# Patient Record
Sex: Female | Born: 1935 | Race: White | Hispanic: No | Smoking: Never smoker
Health system: Southern US, Community
[De-identification: ages and names within clinical notes are randomized; demographics above are authoritative.]

## PROBLEM LIST (undated history)

## (undated) DIAGNOSIS — S065X9A Traumatic subdural hemorrhage with loss of consciousness of unspecified duration, initial encounter: Secondary | ICD-10-CM

## (undated) DIAGNOSIS — N390 Urinary tract infection, site not specified: Secondary | ICD-10-CM

## (undated) DIAGNOSIS — R4182 Altered mental status, unspecified: Secondary | ICD-10-CM

## (undated) DIAGNOSIS — I10 Essential (primary) hypertension: Secondary | ICD-10-CM

## (undated) DIAGNOSIS — S065XAA Traumatic subdural hemorrhage with loss of consciousness status unknown, initial encounter: Secondary | ICD-10-CM

## (undated) DIAGNOSIS — I219 Acute myocardial infarction, unspecified: Secondary | ICD-10-CM

## (undated) DIAGNOSIS — K922 Gastrointestinal hemorrhage, unspecified: Secondary | ICD-10-CM

## (undated) DIAGNOSIS — IMO0002 Reserved for concepts with insufficient information to code with codable children: Secondary | ICD-10-CM

## (undated) DIAGNOSIS — R1310 Dysphagia, unspecified: Secondary | ICD-10-CM

## (undated) DIAGNOSIS — IMO0001 Reserved for inherently not codable concepts without codable children: Secondary | ICD-10-CM

## (undated) DIAGNOSIS — M6282 Rhabdomyolysis: Secondary | ICD-10-CM

## (undated) DIAGNOSIS — F329 Major depressive disorder, single episode, unspecified: Secondary | ICD-10-CM

## (undated) DIAGNOSIS — F32A Depression, unspecified: Secondary | ICD-10-CM

## (undated) DIAGNOSIS — K219 Gastro-esophageal reflux disease without esophagitis: Secondary | ICD-10-CM

## (undated) DIAGNOSIS — F0150 Vascular dementia without behavioral disturbance: Secondary | ICD-10-CM

## (undated) HISTORY — DX: Acute myocardial infarction, unspecified: I21.9

## (undated) HISTORY — DX: Gastrointestinal hemorrhage, unspecified: K92.2

## (undated) HISTORY — DX: Depression, unspecified: F32.A

## (undated) HISTORY — PX: CHOLECYSTECTOMY: SHX55

## (undated) HISTORY — DX: Major depressive disorder, single episode, unspecified: F32.9

## (undated) HISTORY — DX: Reserved for concepts with insufficient information to code with codable children: IMO0002

## (undated) HISTORY — PX: HERNIA REPAIR: SHX51

## (undated) HISTORY — DX: Gastro-esophageal reflux disease without esophagitis: K21.9

## (undated) HISTORY — DX: Essential (primary) hypertension: I10

## (undated) HISTORY — DX: Vascular dementia without behavioral disturbance: F01.50

---

## 1999-02-14 ENCOUNTER — Other Ambulatory Visit: Admission: RE | Admit: 1999-02-14 | Discharge: 1999-02-14 | Payer: Self-pay | Admitting: Obstetrics and Gynecology

## 2000-03-23 ENCOUNTER — Encounter: Admission: RE | Admit: 2000-03-23 | Discharge: 2000-03-23 | Payer: Self-pay | Admitting: Obstetrics and Gynecology

## 2000-03-23 ENCOUNTER — Encounter: Payer: Self-pay | Admitting: Obstetrics and Gynecology

## 2000-07-01 ENCOUNTER — Other Ambulatory Visit: Admission: RE | Admit: 2000-07-01 | Discharge: 2000-07-01 | Payer: Self-pay | Admitting: Obstetrics and Gynecology

## 2001-01-14 ENCOUNTER — Ambulatory Visit (HOSPITAL_COMMUNITY): Admission: RE | Admit: 2001-01-14 | Discharge: 2001-01-14 | Payer: Self-pay | Admitting: Internal Medicine

## 2001-01-14 ENCOUNTER — Encounter: Payer: Self-pay | Admitting: Internal Medicine

## 2001-03-24 ENCOUNTER — Encounter: Payer: Self-pay | Admitting: Obstetrics and Gynecology

## 2001-03-24 ENCOUNTER — Encounter: Admission: RE | Admit: 2001-03-24 | Discharge: 2001-03-24 | Payer: Self-pay | Admitting: Obstetrics and Gynecology

## 2001-12-14 ENCOUNTER — Encounter: Payer: Self-pay | Admitting: Internal Medicine

## 2001-12-14 ENCOUNTER — Ambulatory Visit (HOSPITAL_COMMUNITY): Admission: RE | Admit: 2001-12-14 | Discharge: 2001-12-14 | Payer: Self-pay | Admitting: Internal Medicine

## 2002-01-04 ENCOUNTER — Encounter: Payer: Self-pay | Admitting: Surgery

## 2002-01-06 ENCOUNTER — Observation Stay (HOSPITAL_COMMUNITY): Admission: RE | Admit: 2002-01-06 | Discharge: 2002-01-08 | Payer: Self-pay | Admitting: Surgery

## 2002-04-05 ENCOUNTER — Encounter: Payer: Self-pay | Admitting: Obstetrics and Gynecology

## 2002-04-05 ENCOUNTER — Encounter: Admission: RE | Admit: 2002-04-05 | Discharge: 2002-04-05 | Payer: Self-pay | Admitting: Obstetrics and Gynecology

## 2002-09-13 ENCOUNTER — Encounter: Payer: Self-pay | Admitting: Internal Medicine

## 2002-09-13 ENCOUNTER — Encounter: Admission: RE | Admit: 2002-09-13 | Discharge: 2002-09-13 | Payer: Self-pay | Admitting: Internal Medicine

## 2002-09-27 ENCOUNTER — Encounter (INDEPENDENT_AMBULATORY_CARE_PROVIDER_SITE_OTHER): Payer: Self-pay | Admitting: Specialist

## 2002-09-27 ENCOUNTER — Observation Stay (HOSPITAL_COMMUNITY): Admission: RE | Admit: 2002-09-27 | Discharge: 2002-09-28 | Payer: Self-pay | Admitting: Surgery

## 2003-04-07 ENCOUNTER — Encounter: Payer: Self-pay | Admitting: Internal Medicine

## 2003-04-07 ENCOUNTER — Encounter: Admission: RE | Admit: 2003-04-07 | Discharge: 2003-04-07 | Payer: Self-pay | Admitting: Internal Medicine

## 2004-04-12 ENCOUNTER — Encounter: Admission: RE | Admit: 2004-04-12 | Discharge: 2004-04-12 | Payer: Self-pay | Admitting: Obstetrics and Gynecology

## 2005-04-14 ENCOUNTER — Encounter: Admission: RE | Admit: 2005-04-14 | Discharge: 2005-04-14 | Payer: Self-pay | Admitting: Obstetrics and Gynecology

## 2006-04-17 ENCOUNTER — Encounter: Admission: RE | Admit: 2006-04-17 | Discharge: 2006-04-17 | Payer: Self-pay | Admitting: Obstetrics and Gynecology

## 2007-04-20 ENCOUNTER — Encounter: Admission: RE | Admit: 2007-04-20 | Discharge: 2007-04-20 | Payer: Self-pay | Admitting: Obstetrics and Gynecology

## 2007-06-15 ENCOUNTER — Encounter: Admission: RE | Admit: 2007-06-15 | Discharge: 2007-06-15 | Payer: Self-pay | Admitting: Obstetrics and Gynecology

## 2009-01-31 ENCOUNTER — Encounter (HOSPITAL_BASED_OUTPATIENT_CLINIC_OR_DEPARTMENT_OTHER): Admission: RE | Admit: 2009-01-31 | Discharge: 2009-05-01 | Payer: Self-pay | Admitting: General Surgery

## 2009-05-07 ENCOUNTER — Encounter: Admission: RE | Admit: 2009-05-07 | Discharge: 2009-07-17 | Payer: Self-pay | Admitting: General Surgery

## 2009-06-22 ENCOUNTER — Inpatient Hospital Stay (HOSPITAL_COMMUNITY): Admission: EM | Admit: 2009-06-22 | Discharge: 2009-06-28 | Payer: Self-pay | Admitting: Emergency Medicine

## 2009-06-25 ENCOUNTER — Encounter (INDEPENDENT_AMBULATORY_CARE_PROVIDER_SITE_OTHER): Payer: Self-pay | Admitting: Internal Medicine

## 2009-06-25 ENCOUNTER — Ambulatory Visit: Payer: Self-pay | Admitting: *Deleted

## 2009-11-26 ENCOUNTER — Inpatient Hospital Stay (HOSPITAL_COMMUNITY): Admission: EM | Admit: 2009-11-26 | Discharge: 2009-11-30 | Payer: Self-pay | Admitting: Emergency Medicine

## 2010-11-05 ENCOUNTER — Ambulatory Visit (HOSPITAL_COMMUNITY): Admission: RE | Admit: 2010-11-05 | Discharge: 2010-11-05 | Payer: Self-pay | Admitting: Internal Medicine

## 2011-01-05 ENCOUNTER — Encounter: Payer: Self-pay | Admitting: Obstetrics and Gynecology

## 2011-01-06 ENCOUNTER — Encounter: Payer: Self-pay | Admitting: Obstetrics and Gynecology

## 2011-03-17 LAB — CBC
HCT: 25.9 % — ABNORMAL LOW (ref 36.0–46.0)
Hemoglobin: 8.7 g/dL — ABNORMAL LOW (ref 12.0–15.0)
Hemoglobin: 9.1 g/dL — ABNORMAL LOW (ref 12.0–15.0)
MCHC: 33.3 g/dL (ref 30.0–36.0)
MCHC: 33.5 g/dL (ref 30.0–36.0)
MCV: 88 fL (ref 78.0–100.0)
MCV: 88.4 fL (ref 78.0–100.0)
Platelets: 240 10*3/uL (ref 150–400)
Platelets: 279 10*3/uL (ref 150–400)
Platelets: 287 10*3/uL (ref 150–400)
RBC: 2.96 MIL/uL — ABNORMAL LOW (ref 3.87–5.11)
RBC: 3.13 MIL/uL — ABNORMAL LOW (ref 3.87–5.11)
RBC: 3.42 MIL/uL — ABNORMAL LOW (ref 3.87–5.11)
RDW: 16.3 % — ABNORMAL HIGH (ref 11.5–15.5)
WBC: 4.2 10*3/uL (ref 4.0–10.5)
WBC: 4.8 10*3/uL (ref 4.0–10.5)
WBC: 5.5 10*3/uL (ref 4.0–10.5)

## 2011-03-17 LAB — BASIC METABOLIC PANEL
BUN: 14 mg/dL (ref 6–23)
BUN: 6 mg/dL (ref 6–23)
CO2: 24 mEq/L (ref 19–32)
Calcium: 7.7 mg/dL — ABNORMAL LOW (ref 8.4–10.5)
Chloride: 120 mEq/L — ABNORMAL HIGH (ref 96–112)
Creatinine, Ser: 0.48 mg/dL (ref 0.4–1.2)
GFR calc non Af Amer: >60 mL/min (ref >60)
Glucose, Bld: 115 mg/dL — ABNORMAL HIGH (ref 70–99)
Sodium: 147 mEq/L — ABNORMAL HIGH (ref 135–145)

## 2011-03-18 LAB — CBC
HCT: 26.5 % — ABNORMAL LOW (ref 36.0–46.0)
HCT: 27.2 % — ABNORMAL LOW (ref 36.0–46.0)
Hemoglobin: 8.9 g/dL — ABNORMAL LOW (ref 12.0–15.0)
Hemoglobin: 9.2 g/dL — ABNORMAL LOW (ref 12.0–15.0)
MCV: 87.8 fL (ref 78.0–100.0)
MCV: 88.8 fL (ref 78.0–100.0)
Platelets: 260 10*3/uL (ref 150–400)
Platelets: 274 10*3/uL (ref 150–400)
Platelets: 351 10*3/uL (ref 150–400)
RBC: 2.32 MIL/uL — ABNORMAL LOW (ref 3.87–5.11)
RBC: 3 MIL/uL — ABNORMAL LOW (ref 3.87–5.11)
RBC: 3.18 MIL/uL — ABNORMAL LOW (ref 3.87–5.11)
RDW: 16.2 % — ABNORMAL HIGH (ref 11.5–15.5)
WBC: 4.7 10*3/uL (ref 4.0–10.5)
WBC: 5.6 10*3/uL (ref 4.0–10.5)
WBC: 8 10*3/uL (ref 4.0–10.5)

## 2011-03-18 LAB — URINALYSIS, ROUTINE W REFLEX MICROSCOPIC
Bilirubin Urine: NEGATIVE
Glucose, UA: NEGATIVE mg/dL
Hgb urine dipstick: NEGATIVE
Ketones, ur: NEGATIVE mg/dL
Ketones, ur: NEGATIVE mg/dL
Nitrite: NEGATIVE
Protein, ur: 30 mg/dL — AB
Specific Gravity, Urine: 1.021 (ref 1.005–1.030)
Urobilinogen, UA: 0.2 mg/dL (ref 0.0–1.0)
Urobilinogen, UA: 0.2 mg/dL (ref 0.0–1.0)
pH: 5.5 (ref 5.0–8.0)
pH: 6.5 (ref 5.0–8.0)

## 2011-03-18 LAB — CROSSMATCH
ABO/RH(D): O POS
Antibody Screen: NEGATIVE

## 2011-03-18 LAB — COMPREHENSIVE METABOLIC PANEL
ALT: 21 U/L (ref 0–35)
Alkaline Phosphatase: 40 U/L (ref 39–117)
BUN: 24 mg/dL — ABNORMAL HIGH (ref 6–23)
CO2: 25 mEq/L (ref 19–32)
Chloride: 119 mEq/L — ABNORMAL HIGH (ref 96–112)
Glucose, Bld: 74 mg/dL (ref 70–99)
Potassium: 3.8 mEq/L (ref 3.5–5.1)
Sodium: 147 mEq/L — ABNORMAL HIGH (ref 135–145)
Total Bilirubin: 0.7 mg/dL (ref 0.3–1.2)

## 2011-03-18 LAB — BASIC METABOLIC PANEL
Chloride: 107 mEq/L (ref 96–112)
Chloride: 114 mEq/L — ABNORMAL HIGH (ref 96–112)
GFR calc Af Amer: 34 mL/min — ABNORMAL LOW (ref >60)
GFR calc non Af Amer: 39 mL/min — ABNORMAL LOW (ref >60)
Glucose, Bld: 67 mg/dL — ABNORMAL LOW (ref 70–99)
Potassium: 4 mEq/L (ref 3.5–5.1)
Potassium: 4.7 mEq/L (ref 3.5–5.1)
Sodium: 140 mEq/L (ref 135–145)
Sodium: 145 mEq/L (ref 135–145)

## 2011-03-18 LAB — URINE MICROSCOPIC-ADD ON

## 2011-03-18 LAB — CARDIAC PANEL(CRET KIN+CKTOT+MB+TROPI)
CK, MB: 17 ng/mL — ABNORMAL HIGH (ref 0.3–4.0)
CK, MB: 18.1 ng/mL — ABNORMAL HIGH (ref 0.3–4.0)
Relative Index: 2.4 (ref 0.0–2.5)
Total CK: 692 U/L — ABNORMAL HIGH (ref 7–177)

## 2011-03-18 LAB — URINE CULTURE
Culture: NO GROWTH
Special Requests: NEGATIVE

## 2011-03-18 LAB — RPR: RPR Ser Ql: NONREACTIVE

## 2011-03-18 LAB — TSH: TSH: 2.021 u[IU]/mL (ref 0.350–4.500)

## 2011-03-18 LAB — FOLATE: Folate: >20 ng/mL

## 2011-03-23 LAB — BASIC METABOLIC PANEL
BUN: 5 mg/dL — ABNORMAL LOW (ref 6–23)
Calcium: 8.6 mg/dL (ref 8.4–10.5)
Chloride: 105 mEq/L (ref 96–112)
Creatinine, Ser: 0.83 mg/dL (ref 0.4–1.2)
GFR calc Af Amer: >60 mL/min (ref >60)
GFR calc non Af Amer: >60 mL/min (ref >60)
GFR calc non Af Amer: >60 mL/min (ref >60)
Potassium: 4.1 mEq/L (ref 3.5–5.1)
Sodium: 137 mEq/L (ref 135–145)
Sodium: 141 mEq/L (ref 135–145)

## 2011-03-23 LAB — DIFFERENTIAL
Basophils Absolute: 0 10*3/uL (ref 0.0–0.1)
Basophils Relative: 0 % (ref 0–1)
Monocytes Absolute: 0.8 10*3/uL (ref 0.1–1.0)
Neutro Abs: 12.1 10*3/uL — ABNORMAL HIGH (ref 1.7–7.7)
Neutrophils Relative %: 89 % — ABNORMAL HIGH (ref 43–77)

## 2011-03-23 LAB — COMPREHENSIVE METABOLIC PANEL
AST: 30 U/L (ref 0–37)
Alkaline Phosphatase: 46 U/L (ref 39–117)
Alkaline Phosphatase: 50 U/L (ref 39–117)
BUN: 10 mg/dL (ref 6–23)
BUN: 8 mg/dL (ref 6–23)
CO2: 24 mEq/L (ref 19–32)
CO2: 25 mEq/L (ref 19–32)
Chloride: 114 mEq/L — ABNORMAL HIGH (ref 96–112)
Chloride: 114 mEq/L — ABNORMAL HIGH (ref 96–112)
Creatinine, Ser: 0.44 mg/dL (ref 0.4–1.2)
GFR calc Af Amer: >60 mL/min (ref >60)
GFR calc non Af Amer: >60 mL/min (ref >60)
GFR calc non Af Amer: >60 mL/min (ref >60)
Glucose, Bld: 101 mg/dL — ABNORMAL HIGH (ref 70–99)
Potassium: 3.7 mEq/L (ref 3.5–5.1)
Potassium: 3.9 mEq/L (ref 3.5–5.1)
Total Bilirubin: 0.7 mg/dL (ref 0.3–1.2)
Total Bilirubin: 0.8 mg/dL (ref 0.3–1.2)
Total Protein: 5.2 g/dL — ABNORMAL LOW (ref 6.0–8.3)

## 2011-03-23 LAB — CBC
HCT: 32.1 % — ABNORMAL LOW (ref 36.0–46.0)
HCT: 35.3 % — ABNORMAL LOW (ref 36.0–46.0)
HCT: 36.9 % (ref 36.0–46.0)
Hemoglobin: 11.9 g/dL — ABNORMAL LOW (ref 12.0–15.0)
Hemoglobin: 13.9 g/dL (ref 12.0–15.0)
MCHC: 31.9 g/dL (ref 30.0–36.0)
MCV: 85.8 fL (ref 78.0–100.0)
MCV: 86.2 fL (ref 78.0–100.0)
MCV: 86.4 fL (ref 78.0–100.0)
Platelets: 229 10*3/uL (ref 150–400)
Platelets: 263 10*3/uL (ref 150–400)
RBC: 3.72 MIL/uL — ABNORMAL LOW (ref 3.87–5.11)
RBC: 4.28 MIL/uL (ref 3.87–5.11)
RBC: 5.03 MIL/uL (ref 3.87–5.11)
WBC: 13.5 10*3/uL — ABNORMAL HIGH (ref 4.0–10.5)
WBC: 6.1 10*3/uL (ref 4.0–10.5)
WBC: 6.3 10*3/uL (ref 4.0–10.5)
WBC: 8.4 10*3/uL (ref 4.0–10.5)

## 2011-03-23 LAB — LIPID PANEL
Cholesterol: 160 mg/dL (ref 0–200)
HDL: 75 mg/dL (ref >39)
Total CHOL/HDL Ratio: 2.1 RATIO

## 2011-03-23 LAB — URINE MICROSCOPIC-ADD ON

## 2011-03-23 LAB — CULTURE, BLOOD (ROUTINE X 2)

## 2011-03-23 LAB — URINALYSIS, ROUTINE W REFLEX MICROSCOPIC
Bilirubin Urine: NEGATIVE
Glucose, UA: NEGATIVE mg/dL
Protein, ur: NEGATIVE mg/dL
Specific Gravity, Urine: 1.02 (ref 1.005–1.030)
Urobilinogen, UA: 0.2 mg/dL (ref 0.0–1.0)

## 2011-03-23 LAB — TSH: TSH: 2.446 u[IU]/mL (ref 0.350–4.500)

## 2011-03-23 LAB — APTT: aPTT: 38 seconds — ABNORMAL HIGH (ref 24–37)

## 2011-03-23 LAB — PROTIME-INR: Prothrombin Time: 14.8 seconds (ref 11.6–15.2)

## 2011-03-23 LAB — CK TOTAL AND CKMB (NOT AT ARMC)
CK, MB: 3 ng/mL (ref 0.3–4.0)
CK, MB: 46.4 ng/mL — ABNORMAL HIGH (ref 0.3–4.0)
Relative Index: 1.8 (ref 0.0–2.5)
Relative Index: 2.7 — ABNORMAL HIGH (ref 0.0–2.5)
Total CK: 1732 U/L — ABNORMAL HIGH (ref 7–177)

## 2011-03-23 LAB — URINE CULTURE
Colony Count: >=100000
Special Requests: NEGATIVE

## 2011-03-23 LAB — HEPATIC FUNCTION PANEL
AST: 70 U/L — ABNORMAL HIGH (ref 0–37)
Albumin: 3 g/dL — ABNORMAL LOW (ref 3.5–5.2)
Total Protein: 6.3 g/dL (ref 6.0–8.3)

## 2011-03-23 LAB — CARDIAC PANEL(CRET KIN+CKTOT+MB+TROPI)
Relative Index: 1.4 (ref 0.0–2.5)
Total CK: 934 U/L — ABNORMAL HIGH (ref 7–177)
Troponin I: 0.52 ng/mL (ref 0.00–0.06)

## 2011-03-23 LAB — SEDIMENTATION RATE: Sed Rate: 20 mm/hr (ref 0–22)

## 2011-03-23 LAB — T4, FREE
Free T4: 0.76 ng/dL — ABNORMAL LOW (ref 0.80–1.80)
Free T4: 0.93 ng/dL (ref 0.80–1.80)

## 2011-04-29 NOTE — Assessment & Plan Note (Signed)
[facility]   NAME:  Nancy [name], Nancy [name]                  ACCOUNT NO.:  [id number]   MEDICAL RECORD NO.:  [id number]      DATE OF BIRTH:  1924-09-02   PHYSICIAN:  Jonelle Sports. Sevier, M.D.       VISIT DATE:                                   OFFICE VISIT   HISTORY:  This is an 77 year old white female has been followed  generally by Dr. Lurene Shadow for a large wound on the distal anteromedial  pretibial area of the right lower extremity and small pressure ulcers on  the Achilles tendon area posteriorly at both heels.  She was debrided  significantly of the right lower extremity wound and was begun on  treatment with Santyl ointment changed on a daily basis.  That extremity  had been placed on Kerlix and Coban wrap.   The heel wounds have been treated with simply nonstick dressings.  She  had been instructed in proper offloading.   Since that visit, the patient and her family report no particular  problems.   PHYSICAL EXAMINATION:  VITAL SIGNS:  Blood pressure 116/67, pulse 81,  respirations 18, and temperature 97.4.  EXTREMITIES:  A large wound on the right anterior lower extremity  measures 11.0 x 6.0 x 0.1 cm and actually its base is  reasonably clean,  but quite strikingly dry.  There is no evidence of infection.   The heel wounds continue to be relatively innocent, although the one on  the left is slightly open.   A new wound is added today when some dry skin is removed at the area of  the tibial tubercle on the left leg and a small underlying sore is  revealed.   IMPRESSION:  Considerable improvement of right lower extremity wound and  stability of other wounds.   DISPOSITION:  The heel wounds and the new wound at the left knee will be  treated with daily applications of Neosporin and Band-Aids with proper  cleansing with each dressing change.   The right lower extremity will be treated with hydrogel, Prisma, Kerlix,  and Coban wrap.  Home health nurses will  change that on a q.3-day basis.   Follow up visit will be here in 2 weeks.           ______________________________  Jonelle Sports Cheryll Cockayne, M.D.     RES/MEDQ  D:  03/05/2009  T:  03/06/2009  Job:  [id number]

## 2011-04-29 NOTE — Assessment & Plan Note (Signed)
[facility]   NAME:  Nancy [name], Nancy [name]                  ACCOUNT NO.:  [id number]   MEDICAL RECORD NO.:  [id number]      DATE OF BIRTH:  October 22, 1924   PHYSICIAN:  Oretha Milch, MD            VISIT DATE:                                   OFFICE VISIT   This lady is back for reevaluation and treatment after a week.  She has  venous stasis ulcers and may have an ischemic ulcer on the plantar  aspect of her left great toe.  I debrided an area about 2 cm in diameter  off of her left toe and got down to fairly good pink granulation tissue.  I did this with the scissors and curette.  The ulcers on the anterior  aspect of her right lower extremity looked clean with good granulation  tissue.  All of her skin on both of her calves have the typical venous  stasis dermatitis.  We put back an Unna boot on the right leg and simple  compression on the left leg as there are no ulcers, and then we put on a  Promogran dressing on the left great toe.  They will come back in a  week.      Oretha Milch, MD     RVA/MEDQ  D:  06/04/2009  T:  06/04/2009  Job:  [id number]

## 2011-04-29 NOTE — Assessment & Plan Note (Signed)
[facility]   NAME:  Nancy [name], Nancy [name] NO.:  [id number]   MEDICAL RECORD NO.:  [id number]      DATE OF BIRTH:  June 09, 1924   PHYSICIAN:  Leonie Man, M.D.         VISIT DATE:                                   OFFICE VISIT   [name] is an 77 year old female with a medial right leg venous stasis  ulcer.  Current treatments have been with serial selective debridements  followed by Prisma, hydrogel, and Kerlix wrap followed by Coban.  She  returns for reevaluation today.  The ulcer is significantly smaller  measuring 1 cm x 6.4 cm x 0.2 cm without any evidence of odor or any  significant drainage.   On examination, she is afebrile.  Temperature 98.1, pulse 92,  respirations 80, blood pressure 137/79.   We will continue her on Prisma, hydrogel, with a Kerlix wrap covered by  Coban.  We will have her seen by home health care 3 times weekly, and  she will follow up with [place] in 1 week.      Leonie Man, M.D.  Electronically Signed     PB/MEDQ  D:  04/09/2009  T:  04/10/2009  Job:  [id number]

## 2011-04-29 NOTE — H&P (Signed)
Nancy [name], Nancy [name] NO.:  [id number]   MEDICAL RECORD NO.:  [id number]          PATIENT TYPE:  EMS   LOCATION:  ED                           FACILITY:  [facility]   PHYSICIAN:  Vania Rea, M.D. DATE OF BIRTH:  1924/08/24   DATE OF ADMISSION:  06/22/2009  DATE OF DISCHARGE:                              HISTORY & PHYSICAL   PRIMARY CARE PHYSICIAN:  Dr. Lucky Cowboy   SURGEON:  Dr. Leonie Man   CHIEF COMPLAINT:  Altered mental status.   HISTORY OF PRESENT ILLNESS:  This is an 77 year old Caucasian lady who  lives alone but has home health come in 3 times a week to dress her skin  ulcers and also has [employer] come in with her daily meal who  usually ambulates without the assistance of a cane and who was found  down on the floor this afternoon by her son.  Last contact with this  patient was last night, and it is unclear exactly how long she was down  on the ground, but it may be as much as 18 hours.  She was brought to  the emergency room and evaluated, found to be dehydrated, confused,  evidence of urinary tract infection and evidence of an acute myocardial  infarction.  Cardiologist was called for admission but felt that acute  intervention was contraindicated and recommended that hospitalist  service admit for medical stabilization, and cardiologist will follow  along as a consult.   Family says that patient does read the newspaper every day, but she does  not usually appear to be fully alert and with it.  She does not usually  manage her own financial affairs, but she has not been formally  diagnosed with dementia.  She has chronic leg ulcers and is seen at the  wound clinic every Monday and seen by home health nurse for dressings  every Wednesday and Friday.  There have been no complaints of chest pain  or shortness of breath.  She does report episodic dizziness.  She does  have a remote history of a craniotomy for a subdural hematoma in  1997  and has had difficulty with ambulation since then.  There is no history  of fever, cough or cold.  She does not have history of lower extremity  edema, but her lower extremity ulcers carry a diagnosis of venous stasis  ulcers.   She has been steadily losing weight over the past 2 years, and she is  estimated to have lost about 30 pounds over that period of time.   PAST MEDICAL HISTORY:  1. History of subdural hematoma as noted above.  2. Chronic lower extremity ulcers.  3. Sacral decubitus ulcer.  4. Status post hernia repair 5 years ago.  5. Status post cholecystectomy.   MEDICATIONS:  None.   ALLERGIES:  None.   SOCIAL HISTORY:  No history of tobacco, alcohol or illicit drug use.  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  She was widowed in 1986, and she  has one son who lives nearby and assists with  looking after her.   FAMILY HISTORY:  Unable to obtain at this time.   REVIEW OF SYSTEMS:  Other than those above, unable to obtain further.   PHYSICAL EXAMINATION:  GENERAL:  Pleasant, elderly, Caucasian lady lying  in the bed who appears dehydrated but does not appear distressed.  Very  conversational.  She is oriented only to herself but not to place or  time.  VITAL SIGNS:  Temperature is 96.8, pulse 88 and irregular, respiration  18, blood pressure 111/61.  She is saturating at 100% on 2 L.  HEENT:  Her pupils are round and equal.  Mucous membranes are pink, dry,  anicteric.  She is completely edentulous.  She has no oral Candida.  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  NECK:  She has wasting of the neck muscles, no cervical lymphadenopathy  or thyromegaly.  No carotid bruit, no jugular venous distention.  CHEST:  Clear to auscultation bilaterally.  CARDIOVASCULAR SYSTEM:  Frequent irregular beats, no murmur heard.  ABDOMEN:  Scaphoid, soft and nontender.  EXTREMITIES:  She has compression bandage of left lower extremity from   toes to knee.  Right is bandaged, and right leg is erythematous with  multiple ulcers with various stages of healing and discoloration of the  skin.  She has fresh abrasion to the left knee.  SACRAL AREA:  She has  stage II decubitus ulcer on her sacrum covered by a bandage.  CENTRAL NERVOUS SYSTEM:  Cranial nerves II-XII appear to be grossly  intact, and she has no focal lateralizing signs.   LABORATORIES:  White count his 13.5, hemoglobin 13.9, MCV 86.3,  platelets 271, absolute granulocyte count 12.1.  Her sodium is 141,  potassium 4.1, chloride 106, CO2 of 24, BUN 21, creatinine 0.8, calcium  8.6.  Total CK is 1732, CK-MB 6.4 with a relative index of 2.7.  Her  troponin is 0.73.  PTT is 38, PT 14.8, INR 1.1.  Urinalysis shows cloudy  urine with specific gravity of 1.020, 40 ketones, nitrite positive and  leukocyte esterase trace.  There is no protein, and urinalysis shows  rare epithelial cells, white cells 21-50 with many bacteria.   CT scan of the head without contrast shows no acute abnormality, atrophy  with chronic small vessel white matter __________ demyelination.  Lumbar  x-ray shows normal alignment, no fracture.  There is no chest x-ray as  yet.  Her EKG shows sinus rhythm with premature ventricular complexes as  well as premature supraventricular complexes, ST segment depression in  the inferior leads and prolonged QT.   ASSESSMENT:  1. Acute myocardial infarction.  2. Urinary tract infection.  3. Infected lower extremity ulcers.  4. Sacral decubitus ulcer.  5. Delirium/dementia.  6. Malnutrition.  7. Dehydration.   PLAN:  antibiotic  We will admit this lady for intravenous fluid hydration and antibiotic  therapy for her skin wounds and urinary tract infection after  appropriate cultures.  We will consider vascular studies of her lower  extremity.  Cardiac problems as per cardiology.      Vania Rea, M.D.  Electronically Signed     LC/MEDQ  D:   06/22/2009  T:  06/22/2009  Job:  [id number]   cc:   Ricki Rodriguez, M.D.  Fax: [phone]   Annye Rusk, MD

## 2011-04-29 NOTE — Assessment & Plan Note (Signed)
[facility]   NAME:  Nancy [name], Nancy [name] NO.:  [id number]   MEDICAL RECORD NO.:  [id number]      DATE OF BIRTH:  [birth year]   PHYSICIAN:  Joanne Gavel, M.D.         VISIT DATE:  04/02/2009                                   OFFICE VISIT   This is an 77 year old female with a medial right stasis ulcer.  Current  treatments have been multiple debridements with Prisma, hydrogel,  Kerlix, and Coban wrap.   On evaluation, the patient is awake and alert.  Afebrile.  There is  essentially no slough in the wound.  There is some nice granulation  tissue.  The edges are intact without erythema.   IMPRESSION:  The patient appears to be improving with very little  necrotic material present.   PLAN:  Continue Prisma, hydrogel, Kerlix, and Coban.  See in 7 days.      Joanne Gavel, M.D.  Electronically Signed     RA/MEDQ  D:  04/02/2009  T:  04/03/2009  Job:  [id number]

## 2011-04-29 NOTE — Assessment & Plan Note (Signed)
[facility]   NAME:  Nancy, [name]                  ACCOUNT NO.:  [id number]   MEDICAL RECORD NO.:  [id number]      DATE OF BIRTH:  02-Nov-1924   PHYSICIAN:  Leonie Man, M.D.         VISIT DATE:                                   OFFICE VISIT   PROBLEM:  Bilateral venous stasis disease with right-sided leg ulcer  which is decreased since last measurement to 7.5 x 5.2 x 0.1 cm.  The  patient is not having any pain.  There is minimal drainage and there is  no odor from the wound.   Most recent treatment has been with Prisma, hydrogel, Kerlix, and Coban.   PHYSICAL EXAMINATION:  Temperature 97.6, pulse 85, respirations 17, and  blood pressure is 111/67.  Examination of the wound shows that there is  a good granulations throughout except for very small area of slough  measuring approximately 0.1 cm.  The surrounding wound skin on the right  leg shows significant desquamation, but it is not particularly dry.   On the left leg, there is no open wound, but there is some increase  swelling of the leg today.  This is not tender.  It seems a little bit  warmer than usual.  I think this is just from inflammation of her venous  stasis disease.  I do not discern any infection here.   TREATMENT AND PLANS:  Today on the right leg, we will continue her with  Prisma, hydrogel, Kerlix, and Coban.  We will place an Adaptic dressing  over the wound so as to ease dressing changes.  On the right leg, I will  give her bag balm, Kerlix, and Coban dressing.   DISPOSITION:  We will follow up with her in 1 week.      Leonie Man, M.D.  Electronically Signed     PB/MEDQ  D:  05/21/2009  T:  05/22/2009  Job:  [id number]

## 2011-04-29 NOTE — Assessment & Plan Note (Signed)
[facility]   NAME:  SHAROL, CROGHAN                  ACCOUNT NO.:  [id number]   MEDICAL RECORD NO.:  [id number]      DATE OF BIRTH:  [birth year]   PHYSICIAN:  Leonie Man, M.D.    VISIT DATE:  04/16/2009                                   OFFICE VISIT   PROBLEM:  Venous stasis disease with venous leg ulcer in this 77-year-  old lady.   Current wound ulcer measurements is 10.9 x 7 x 0.2.  This measurement is  somewhat deceiving because within the center of the ulcer, there is a  very large area of epithelialization, which shows the entire ulcer base  to be approximately 40% epithelialized.  Thus, the remaining 60% of the  ulcer shows clean granulation tissues with minimal drainage and no odor.  There is no slough or necrotic debris.  No debridement was required  today.   We will continue her on Prisma, hydrogel, and Kerlix with a Coban, which  will be changed 3 times a week by the visiting nurse service.  I will  follow up with her in 1 week.      Leonie Man, M.D.  Electronically Signed     PB/MEDQ  D:  04/16/2009  T:  04/17/2009  Job:  [id number]

## 2011-04-29 NOTE — Assessment & Plan Note (Signed)
[facility]   NAME:  Nancy [name], Nancy [name]                  ACCOUNT NO.:  [id number]   MEDICAL RECORD NO.:  [id number]      DATE OF BIRTH:  [birth year]   PHYSICIAN:  Leonie Man, M.D.    VISIT DATE:  03/26/2009                                   OFFICE VISIT   [name] is an 77 year old female with a medial right leg venous stasis  ulcer.  Her current treatments have been with debridements followed by  Prisma hydrogel and Kerlix with a Coban wrap.  She returns today for  reevaluation.   On evaluation, the patient is alert and oriented.  She is afebrile with  temperature of 97.2, blood pressure is 119/72, respirations 20, and  pulses 84.  The wound shows moderate slough.  There is an area with red  granulation tissues.  She has some moderate surface drainage.  The wound  integrity is intact without erythema.   IMPRESSION:  The wound appears to be improved given the description from  before.  Necrotic material was selectively debrided with the scalpel.  There was no bleeding.  She was again treated with a Prisma hydrogel,  Kerlix, and Coban with the use of Barrier cream.  Followup with her will  be in 3 weeks.      Leonie Man, M.D.  Electronically Signed     PB/MEDQ  D:  03/26/2009  T:  03/27/2009  Job:  [id number]

## 2011-04-29 NOTE — Assessment & Plan Note (Signed)
[facility]   NAME:  Nancy [name], Nancy [name]                  ACCOUNT NO.:  [id number]   MEDICAL RECORD NO.:  [id number]      DATE OF BIRTH:  1924-01-24   PHYSICIAN:  Leonie Man, M.D.    VISIT DATE:  04/30/2009                                   OFFICE VISIT   PROBLEM:  Venous leg ulcer with dimensions of 8 x 5 x 0.2 cm located on  the left anterior lower extremity.  [name] is an 77 year old lady with  a healing venous leg ulcer progressing well on current therapy last seen  1 week ago where she was treated with prisma, hydrogel on a Kerlix and  Coban dressing which is changed three times weekly.  She is not on any  current antibiotics.   The patient is not complaining of any pain at this time but she does  have some arthritic pain and she has requested some pain medications for  this.   PHYSICAL EXAMINATION:  Temperature 97.3, pulse 73, respirations 20,  blood pressure 116/77.  The surrounding wound skin is clean and except  for some very mild xerosis, there is no edema and there is very minimal  drainage.  The wound edges are clean and the wound base is granulating  well.   ASSESSMENT:  Continued improvement in this venous leg ulcer now down to  8 x 5 x 0.2.   TREATMENT:  Today, we will continue her with prisma, hydrogel, Kerlix  and Coban.  I did do some selective debridement prior to placing  dressings less than 20 cm in the wound bed.   DISPOSITION:  Follow up is planned for I week.      Leonie Man, M.D.  Electronically Signed     PB/MEDQ  D:  04/30/2009  T:  05/01/2009  Job:  [id number]

## 2011-04-29 NOTE — Discharge Summary (Signed)
Nancy [name], Nancy [name]                  ACCOUNT NO.:  [id number]   MEDICAL RECORD NO.:  [id number]          PATIENT TYPE:  INP   LOCATION:  1443                         FACILITY:  [facility]   PHYSICIAN:  Peggye Pitt, M.D. DATE OF BIRTH:  August 28, 1924   DATE OF ADMISSION:  06/22/2009  DATE OF DISCHARGE:  06/28/2009                               DISCHARGE SUMMARY   DISCHARGE DIAGNOSES:  1. Acute Non- ST myocardial infarction.  2. Rhabdomyolysis.  3. Urinary tract infection.  4. Dysphagia.  5. History of subdural hematoma.  6. Chronic lower extremity ulcers.  7. Sacral decubitus ulcer.  8. Status post cholecystectomy.  9. Status post hernia repair 5 years ago.   DISCHARGE MEDICATIONS:  1. Aspirin 81 mg daily.  2. Metoprolol 12.5 mg daily.  3. Cipro 250 mg twice daily until July 02, 2009.  4. Ensure 1 can t.i.d. with meals.  5. Senna 2 tablets p.o. at bedtime.   DISCHARGE DIET:  Soft chopped diet with thin liquids.   CONSULTATIONS THIS HOSPITALIZATION:  Dr. Algie Coffer with cardiology.   IMAGES AND PROCEDURES:  1. Performed during this hospitalization include a chest x-ray on June 22, 2009, that showed mild changes of COPD and chronic bronchitis.      Borderline cardiomegaly with a mildly elevated left hemidiaphragm.  2. CT scan of the head without contrast on June 22, 2009, that showed      no acute intracranial abnormality with atrophy and chronic small      vessel white matter ischemic demyelination.  3. Lumbar spine x-ray on June 22, 2009, that showed osteopenia with no      acute bony abnormality.  4. The patient had a 2-D echocardiogram on June 25, 2009, that showed      normal systolic function with mild concentric hypertrophy.  No      regional wall motion abnormalities, very mild aortic stenosis with      a valve area of 1.25 cm square.  Moderate mitral regurgitation and      mildly dilated left atrium.  Pulmonary pressure of 34 mmHg.  No      evidence of diastolic  dysfunction.   HISTORY AND PHYSICAL EXAM:  For full details, please see dictation by  Dr. Orvan Falconer on June 22, 2009, but in brief, Mrs. Maddux is a very  pleasant 77 year old Caucasian lady who basically has no chronic medical  conditions other than a chronic sacral decub and leg ulcers who is  visited by Home Health three times a week for this issue, who apparently  was down on the ground for about 18 hours.  Last contact with her son  had been the night prior.  The next day because family could not contact  her, they called the ambulance who upon arrival found her to be on the  ground.  She was quite dehydrated, confused with evidence of a urinary  tract infection and a myocardial infarction and, hence, we were called  to admit her for further evaluation and management.  Cardiology  consultation in the name of Dr.  Algie Coffer was also called upon admission  by the emergency department physician that felt that acute intervention  was contraindicated and recommended medical management.   HOSPITAL COURSE BY ACTIVE PROBLEM:  1. Rhabdomyolysis with an initial CK level of over 4000.  At this was      thought to be secondary to prolonged episode of being on the      ground, in addition with her UTI and acute myocardial infarction.      This has now resolved.  Her last CK level was within normal limits.      She was treated with aggressive IV fluid repletion.  2. Urinary tract infection.  She was initially started on IV Rocephin,      transitioned over to p.o. Cipro which she should be on until July 02, 2009.  3. Acute myocardial infarction.  Cardiology has recommended medical      management.  She has been started on an aspirin and a beta-blocker.      We will withhold the statin at this time given her acute      rhabdomyolysis.  She does not need an ACE inhibitor given she has      adequate systolic function.  4. Dysphagia.  She has been evaluated by speech therapy while in the       hospital who has recommended a soft chopped diet with thin liquids      and she will continue this diet at the nursing facility.  5. The rest of chronic medical issues have not been a problem this      hospitalization.   Vital signs upon discharge, blood pressure 135/66, heart rate 99,  respirations 16.  O2 saturations 99% on room air with a temperature of  98.0.   LABS UPON DISCHARGE:  Sodium 137, potassium 4.1, chloride 105, bicarb  27, BUN 5, creatinine 0.58 with a glucose of 100.  WBC 6.3, hemoglobin  11.9 and a platelet count of 263.      Peggye Pitt, M.D.  Electronically Signed     EH/MEDQ  D:  06/28/2009  T:  06/28/2009  Job:  [id number]   cc:   Lucky Cowboy, M.D.  Fax: [phone]   Ricki Rodriguez, M.D.  Fax: [phone]

## 2011-04-29 NOTE — Assessment & Plan Note (Signed)
[facility]   NAME:  Nancy [name], Nancy [name]                  ACCOUNT NO.:  [id number]   MEDICAL RECORD NO.:  [id number]      DATE OF BIRTH:  [birth year]   PHYSICIAN:  Leonie Man, M.D.    VISIT DATE:  05/07/2009                                   OFFICE VISIT   PROBLEM:  Venous leg ulcer, right leg.  Current dimensions are 7.5 x 7.0  x 0.1 which is somewhat smaller than when last seen on Apr 30, 2009.  The patient is being treated with Prisma, hydrogel, Kerlix, and Coban.  She has some difficulty with some pain today when the Prisma was being  removed.  She is not currently on any antibiotics.   PHYSICAL EXAMINATION:  Today, she is afebrile with stable vital signs.   TREATMENT AND PLAN:  The skin shows some moderate xerosis somewhat less  than last time.  There is no surrounding erythema, very minimal  drainage.  The wound edges are clean.  The base of the wound is  granulating well.  There is a small area of skin slough, which is  selectively debrided with a curette today.  We will continue her on  Prisma and hydrogel.  We will place an Adaptic gauze on this as to  relieve the pain on dressing removal at the next time.   DISPOSITION:  Follow up in 1 week.      Leonie Man, M.D.  Electronically Signed     PB/MEDQ  D:  05/07/2009  T:  05/08/2009  Job:  [id number]

## 2011-04-29 NOTE — Assessment & Plan Note (Signed)
[facility]   NAME:  Nancy [name], Nancy [name]                  ACCOUNT NO.:  [id number]   MEDICAL RECORD NO.:  [id number]      DATE OF BIRTH:  Feb 24, 1924   PHYSICIAN:  Leonie Man, M.D.    VISIT DATE:  06/11/2009                                   OFFICE VISIT   PROBLEM:  1. Venous stasis disease in this 77 year old mildly demented female      with wounds of the right lower extremity which are now measuring      1.1 x 0.5 x 0.1 and 2.0 x 0.5 x 0.1 and 1.6 x 1.8 x 0.1.  These      wounds are originally single ulcer, but due to re-epithelialization      this is now developed into 3 separate ulcers.  2. Left grade 2 toe ulcer on the plantar surface measuring 0.3 x 1.0 x      0.1.   [name] is here today with her daughter and home caregiver.  She is  being treated with an Unna boot on the right leg over hydrogel dressings  on a weekly basis.  Her left great toe has been under treatment with  hydrogel, dry sterile dressing on a daily basis and her left leg which  does not have an ulcer except for the area of the great toe has been  treated with Kerlix and Coban wrap, changed every few days.  The patient  is feeling generally well without any specific complaints.  Her blood  pressure is 111/69, respirations 18, and pulse 78.  Her temperature is  98.9.   The ulcers of the right lower extremity are 100% granulating and  continuing to close under the current therapy.  The region of the left  great toe is also closing with about 80% re-epithelialization.  There is  no leg swelling to speak of and the desquamation of the left leg is  significantly less than when last seen.  She does have the other sequela  of stasis dermatitis present.   TREATMENT:  Right lower extremity hydrogel under a dry dressing and an  Unna boot to be changed weekly in the clinic.  The left great toe  hydrogel and dry sterile dressings to be changed daily by Visiting Nurse  Service and left lower extremity  will be treated with a Kerlix and Coban  for continued compression to be changed every 3 days by the Visiting  Nurse Service.  We will see the patient back in 1 week for followup.      Leonie Man, M.D.  Electronically Signed     PB/MEDQ  D:  06/11/2009  T:  06/12/2009  Job:  [id number]

## 2011-04-29 NOTE — Assessment & Plan Note (Signed)
[facility]   NAME:  Nancy [name], Nancy [name]                  ACCOUNT NO.:  [id number]   MEDICAL RECORD NO.:  [id number]      DATE OF BIRTH:  April 09, 1924   PHYSICIAN:  Leonie Man, M.D.    VISIT DATE:  06/19/2009                                   OFFICE VISIT   PROBLEM:  Venous stasis disease with venous leg ulcers in this 77-year-  old lady with wounds over the extremities bilaterally.  On the right  leg, anteriorly there is a 1.5 x 0.7 x 0.1 and a 2.0 x 0.9 x 0.1 and a  1.5 x 2.0 x 0.1.  On the left great toe, there is a small ulcer of 0.2 x  0.2 x 0.1.   All the ulcers on today's evaluation have been becoming smaller.  The  patient does have some odor on her legs but no significant drainage, all  of the edema is down, and the skin edges look clean.  Because of the  June 17, 2009, holiday, the patient was not seen in the office yesterday  and this may be in part the reason for the odor because the wound itself  looks quite clean with good granulations.  There is no significant  drainage.   We will continue her on hydrogel, dry sterile dressing with a Kerlix and  a Coban dressing on both legs to be changed every other day by the  visiting nurse service.  I will see her back in 1 week.      Leonie Man, M.D.  Electronically Signed     PB/MEDQ  D:  06/19/2009  T:  06/20/2009  Job:  [id number]

## 2011-04-29 NOTE — Assessment & Plan Note (Signed)
[facility]   NAME:  Nancy [name], Nancy [name]                  ACCOUNT NO.:  [id number]   MEDICAL RECORD NO.:  [id number]      DATE OF BIRTH:  Feb 03, 1924   PHYSICIAN:  Leonie Man, M.D.    VISIT DATE:  02/16/2009                                   OFFICE VISIT   PROBLEM:  Venous stasis disease with ulcer of the right anterior leg.  On initial measurement, 10 cm x 5.6 x 0.3 cm.  The patient also has  small blood blisters over both the right and left heel, just at the  prominence of the Achilles/calcaneal attachments.   SUBJ: Nancy [name] is an 77 year old lady, followed by Dr. Oneta Rack for  arthritis.  She developed this lesion also over her left anterior leg  some weeks ago.  This has been treated locally at home with soaks.  This  has continued to worsen over time, and she is referred now for  evaluation.   PAST MEDICAL HISTORY:  She has had surgery for epidural hematoma in 1997  and laparoscopic cholecystectomy in 2003 and a left groin hernia on  unknown date.   She takes no prescription medications.   She is allergic to AMITRIPTYLINE.  Her record also shows an allergy to  [facility].   SOCIAL HISTORY:  The patient is here today with her son who appears  quite concerned about his mother.  The patient has no history of  tobacco, alcohol, or recreational drug use.  She has spent her entire  life working within the home.  She is now widowed.   REVIEW OF SYSTEMS:  NEUROLOGIC:  Currently negative without symptoms.  A  history of epidural hematoma. ENDOCRINE:  No diabetes.  No thyroid  problems.  CARDIOVASCULAR:  There is a remote history of hypertension;  however, the patient takes no antihypertensive medications currently,  and her blood pressure on today's evaluation is 110/76.  HEART:  She she  denies any chest pain or shortness of breath.  GASTROINTESTINAL:  History of irritable bowel syndrome in the past.  Currently, she has  occasional diarrhea but no ongoing  constipation.  GENITOURINARY:  Negative.  MUSCULOSKELETAL AND INTEGUMENTARY:  The patient has annual  mammograms with no recent abnormal finding.  She did have an abnormal  mammogram in the past, which was resolved without any surgery.  EXTREMITIES:  Decreased range of motion of both the hips and joints,  consistent with ongoing osteoarthritis.  The left leg wound in the  anterior pretibial region is as noted above.   PHYSICAL EXAMINATION:  GENERAL:  A pleasant well-developed, apparently  somewhat malnourished white female.  VITAL SIGNS:  Her temperature is 97.6, pulse is 92, respirations 20,  blood pressure 110/76.  HEENT:  Head is normocephalic.  Pupils round and regular.  No scleral  icterus.  Nasopharynx and oropharynx are benign.  NECK:  Supple.  There is no palpable thyromegaly, no cervical  adenopathy.  LUNGS:  Clear to auscultation bilaterally.  HEART:  Regular rate and rhythm with few atrial premature beats.  ABDOMEN:  Soft, nontender, nondistended.  Surgical scars at the  umbilicus and in the left groin are well healed.  Bowel sounds  normoactive.  EXTREMITIES:  The dominant wound on the right  anterior leg with  measurements of 10 x 5.6 x 0.3 cm.  There is some mild amount of shaggy  exudate, no odor or drainage.   TREATMENT/DISP: Selective scalpel debridement.  This was rather  uncomfortable for the patient.  Santyl and a damp saline dressings 3 times weekly,  Compression dressing with Kerlix under Coban wrap.  Wrap both legs to maintain compression with a padded dressing over the  heels to avoid any further friction of her heels.  She is advised to keep her heels elevated above the sheets whenever she  sleeps.  Follow up in 2 weeks.  Rx:  for Levaquin, and for collagenase.      Leonie Man, M.D.  Electronically Signed     PB/MEDQ  D:  02/16/2009  T:  02/17/2009  Job:  [id number]   cc:   Lucky Cowboy, M.D.

## 2011-04-29 NOTE — Assessment & Plan Note (Signed)
[facility]   NAME:  Nancy [name], Nancy [name] NO.:  [id number]   MEDICAL RECORD NO.:  [id number]      DATE OF BIRTH:  [birth year]   PHYSICIAN:  Maxwell Caul, M.D. VISIT DATE:  02/26/2009                                   OFFICE VISIT   [name] returns today for followup of a very significant wound on the  right anterior leg, which is probably a combination of venous  insufficiency and probably some degree of ischemic peripheral vascular  disease.  She was seen initially by Dr. Cathey Endow last week, treated with  Santyl dressing, which is being changed 3 times a week, light  compression with Kerlix and Coban.  She also had 2 decubitus ulcers on  her bilateral heels over the Achilles area, which she is simply being  treated with offloading.   On examination of right leg, there is indeed a very extensive wound here  measuring 10.8 x 6.2 x 0.2.  This is covered with a thick, gritty  eschar.  This is not a viable surface for healing.  I attempted  debridement here, although even with thinning for a prolonged period of  time with 5% lidocaine, she really did not tolerate this well enough to  make a significant difference here.   The bilateral heel wounds are dry wounds, but appear to be contracting,  and other than a nonadherent dressing, I do not think they need to be  specifically dressed at this point.  They are very superficial.   IMPRESSION:  1. Significant right anterior lower extremity wound which is likely a      combination of ischemia and venous stasis.  I continued the same      Santyl-based dressing to this wound with moist gauze, which will be      changed by Home Health 3 times a week.  We continued with the      Kerlix and Coban wrap.  I would not want to provide more aggressive      compression here due to significant peripheral vascular disease.  2. Bilateral heel wounds.  We applied nonadherent dressing to this,      which can be  changed every 2-3 days.  She is already well-versed in      pressure relief.   I gave her a prescription for hydrocodone/APAP 5/500 one p.o. q.6 h.  p.r.n. pain.  Unfortunately, she is going to require a difficult set of  debridements to get this wound to heal.  We will have to see if she is  able to tolerate this.           ______________________________  Maxwell Caul, M.D.     MGR/MEDQ  D:  02/26/2009  T:  02/27/2009  Job:  [id number]

## 2011-04-29 NOTE — Assessment & Plan Note (Signed)
[facility]   NAME:  Nancy [name], Nancy [name] NO.:  [id number]   MEDICAL RECORD NO.:  [id number]      DATE OF BIRTH:  February 21, 1924   PHYSICIAN:  Leonie Man, M.D.    VISIT DATE:  03/19/2009                                   OFFICE VISIT   HISTORY:  Nancy [name] is an 77 year old female with a large venous stasis  wound on the medial aspect of her right leg.  This has been followed  serially with debridements and treatment with Prisma, hydrogel, Kerlix,  and a Coban wrap.  She is here today for reevaluation of her wound.  She  is feeling quite well without any specific issues referable to her  wounds.   PHYSICAL EXAMINATION:  VITAL SIGNS:  She is afebrile.  Temperature 97.6,  pulse 94, respirations 20, and blood pressure 132/79.  The wound currently measures 11 cm x 6.5 x 0.1 and the base of the wound  has some mild amount of fibrinous exudate but there is a large  epithelial island within the central portion of the base of the wound  which has some mild scaling on it.  The wound edges appear to be clean.  There is no surrounding erythema.  There is no drainage or odor.   IMPRESSION AND PLAN:  This wound was selectively debrided with a scalpel  and we will continue her on Prisma, hydrogel, Kerlix wrap, and a Coban  for followup in 1 week from now.      Leonie Man, M.D.  Electronically Signed     PB/MEDQ  D:  03/19/2009  T:  03/20/2009  Job:  [id number]

## 2011-05-02 NOTE — Op Note (Signed)
NAME:  [name], [name]                            ACCOUNT NO.:  [id number]   MEDICAL RECORD NO.:  [id number]                   PATIENT TYPE:  AMB   LOCATION:  DAY                                  FACILITY:  [facility]   PHYSICIAN:  Abigail Miyamoto, MD                DATE OF BIRTH:  [birth year]   DATE OF PROCEDURE:  09/27/2002  DATE OF DISCHARGE:                                 OPERATIVE REPORT   PREOPERATIVE DIAGNOSIS:  Symptomatic cholelithiasis.   POSTOPERATIVE DIAGNOSIS:  Symptomatic cholelithiasis.   PROCEDURE:  Laparoscopic cholecystectomy.   SURGEON:  Abigail Miyamoto, MD   ASSISTANT:  Donnie Coffin. Samuella Cota, M.D.   ANESTHESIA:  General endotracheal anesthesia and 0.25% Marcaine.   ESTIMATED BLOOD LOSS:  Minimal.   DESCRIPTION OF PROCEDURE:  The patient was brought to the operating room,  identified as Nancy [name]. She was placed supine on the operating room table  and general anesthesia was induced. Her abdomen was then prepped and draped  in the usual sterile fashion. Using a #15 blade, a small transverse incision  was made below the umbilicus. The incision was carried down to the fascia  which was then opened with the scalpel. A Hemostat was then used to pass  into the peritoneal cavity. Next, a #0 Vicryl pursestring suture was placed  around the fascial opening. The Hasson port was placed through the opening  and insufflation of the abdomen was begun. Next, a 12 mm port was placed in  the patient's epigastrium and two 5 mm ports placed in the patient's right  flank under direct vision. The gallbladder was then identified and retracted  above the liver bed. Several adhesions were then taken down in the  gallbladder bluntly. The cystic duct was then dissected out and clipped  three times proximally, once distally and transected with scissors. The  cystic artery and branch were then identified and clipped proximally and  distally and transected as well. The gallbladder was then  slowly dissected  free from the liver bed with the electrocautery. Once the gallbladder was  retrieved from the liver bed, it was placed in an endosac. Hemostasis was  then achieved in the liver bed with the electrocautery. The gallbladder was  then retrieved through the incision at the umbilicus. The #0 Vicryl at the  umbilicus was tied in place closing the fascial defect. A separate figure-of-  eight #0 Vicryl suture was also placed in the umbilicus. The liver bed was  then again examined, hemostasis achieved. The abdomen was then copiously  irrigated with normal saline. All ports were then removed under direct  vision and the abdomen was deflated. All incisions were then anesthetized  with 0.25% Marcaine and then closed with 4-0 Monocryl subcuticular sutures.  Steri-Strips, gauze and tape were then applied. The patient tolerated the  procedure well. All sponge, needle and instrument counts were correct at the  end  of the procedure. The patient was then extubated in the operating room  and taken in stable condition to the recovery room.                                               Abigail Miyamoto, MD     DB/MEDQ  D:  09/27/2002  T:  09/27/2002  Job:  [id number]

## 2011-05-02 NOTE — Op Note (Signed)
[facility]  Patient:    [name], [name] Visit Number: [id number] MRN: [id number]          Service Type: SUR Location: 3W 0356 01 Attending Physician:  Shelly Rubenstein Dictated by:   Abigail Miyamoto, M.D. Proc. Date: 01/06/01 Admit Date:  01/06/2002                             Operative Report  PREOPERATIVE DIAGNOSIS:  Ventral incisional hernia.  POSTOPERATIVE DIAGNOSIS:  Ventral incisional hernia.  PROCEDURE:  Ventral incisional hernia repair with mesh.  SURGEON:  Abigail Miyamoto, M.D.  ANESTHESIA:  General endotracheal anesthesia and 0.5% Marcaine.  ESTIMATED BLOOD LOSS:  Minimal.  INDICATIONS:  Ms. Shartzer is a pleasant 77 year old female who was found to have left lower quadrant tenderness and a possible hernia on examination.  CAT scan of the abdomen was performed by her primary care physician which revealed a ventral hernia containing a large amount of omental fat, therefore, decision was made to proceed to the operating room for repair.  PROCEDURE IN DETAIL:  Patient was brought to the operating room and identified as Nancy [name].  She was placed supine on the operating table and general anesthesia was induced.  Her abdomen was then prepped and draped in the usual sterile fashion.  Using a #15 blade, a small transverse incision was made in the patients left lower quadrant.  Incision was carried down to the external oblique fascia, which was then opened with a scalpel and further with Metzenbaum scissors.  Patient was found to have a very large hernia sac containing a large amount of omentum.  The sac was dissected free circumferentially and then opened at the base and then completely excised.  A large amount of omentum was contained in the sac and was placed back into the abdominal cavity.  The fascial defect was then closed with a running #1 Prolene suture.  A piece of ______ mesh was brought into the field and fashioned appropriately  and sewn in an onlay fashion with interrupted Vicryl sutures over the fascial defect as well.  External oblique fascia was then closed over this with a running 2-0 Vicryl suture.  Scarpas fascia was then closed with interrupted 3-0 Vicryl sutures and skin was closed with running 4-0 Vicryl suture.  Steri-Strips, gauze and tape were then applied.  Patient tolerated the procedure well.  All sponge, needle and instrument counts were correct at the end of the procedure.  Patient was then extubated in the operating room and taken in stable condition to the recovery room.  Dictated by:   Abigail Miyamoto, M.D. Attending Physician:  Shelly Rubenstein DD:  01/06/02 TD:  01/07/02 Job: [id number]

## 2011-07-04 IMAGING — CT CT HEAD W/O CM
2 series · 16 of 30 positions shown, 20 images · non-contrast
Comparison: 06/22/2009.

CLINICAL DATA: 85-year-old female with altered level of
consciousness, possible stroke.  Gastrointestinal bleeding.

CT HEAD WITHOUT CONTRAST
TECHNIQUE: Contiguous axial images were obtained from the base of
the skull through the vertex without contrast.

[Series 2: brain · axial · 0.47mm/px · z∈[+120,+255]mm · 13 of 44 slices shown, 17 images]
[im 4/44  brain]
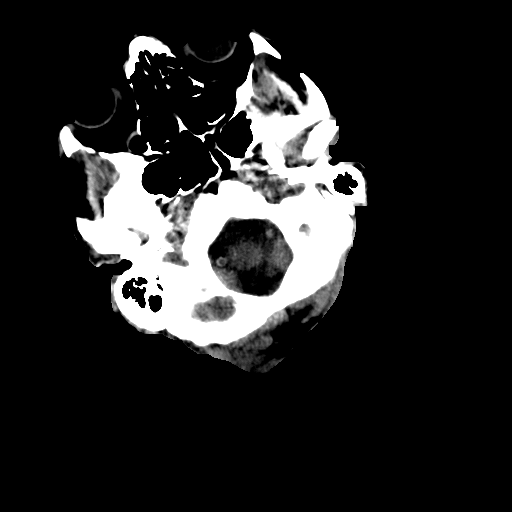
[im 4/44  bone]
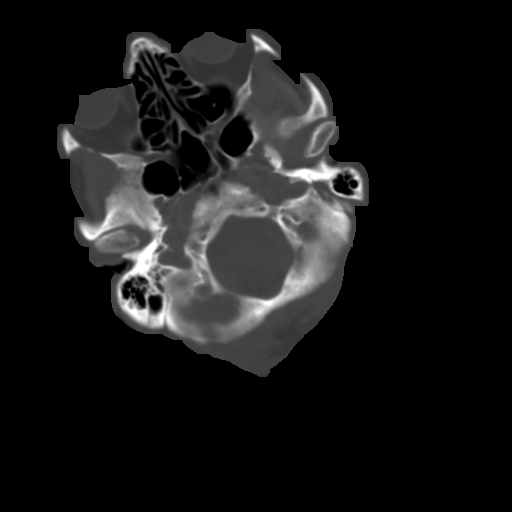
[im 7/44  brain]
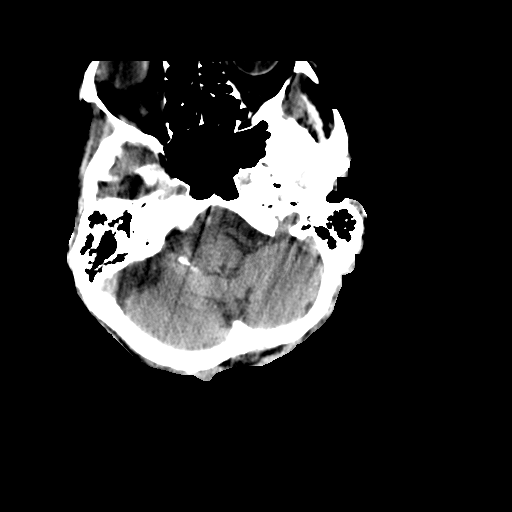
[im 10/44  brain]
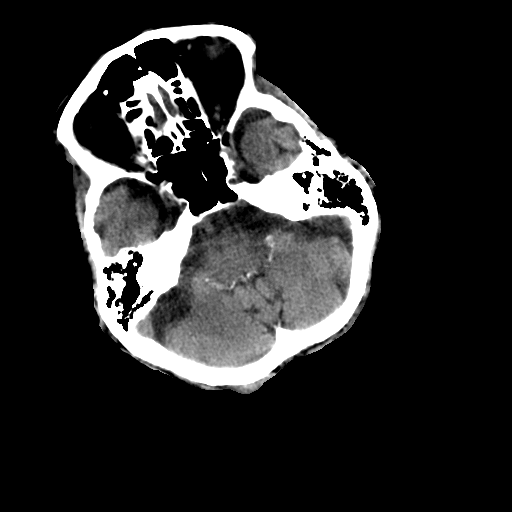
[im 13/44  brain]
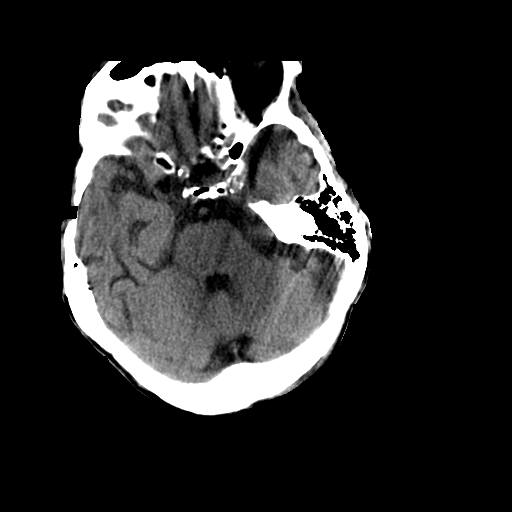
[im 16/44  brain]
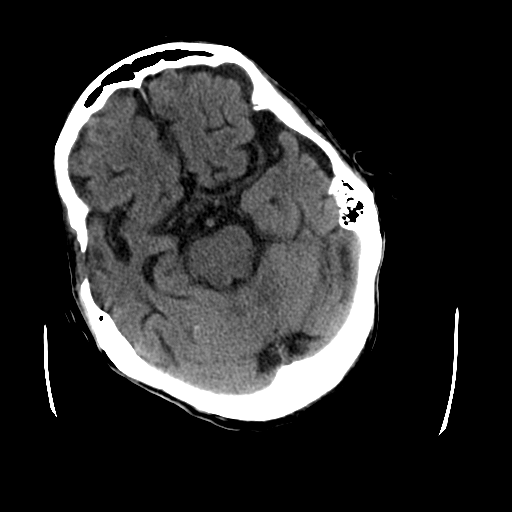
[im 16/44  bone]
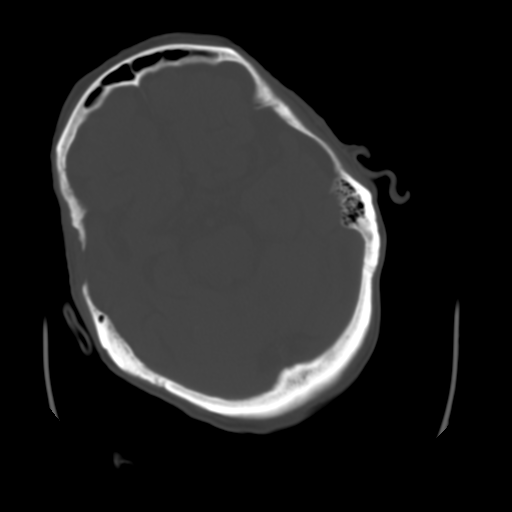
[im 19/44  brain]
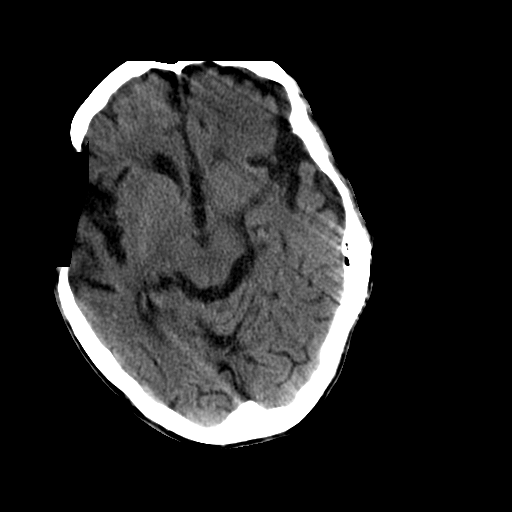
[im 22/44  brain]
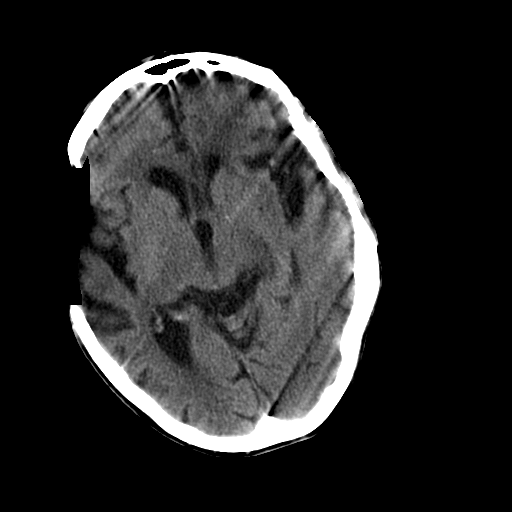
[im 25/44  brain]
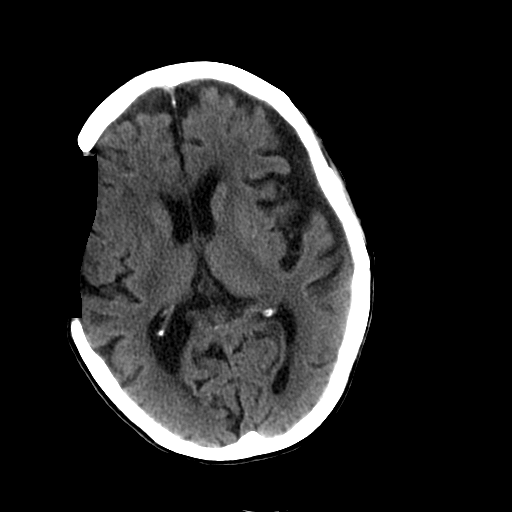
[im 28/44  brain]
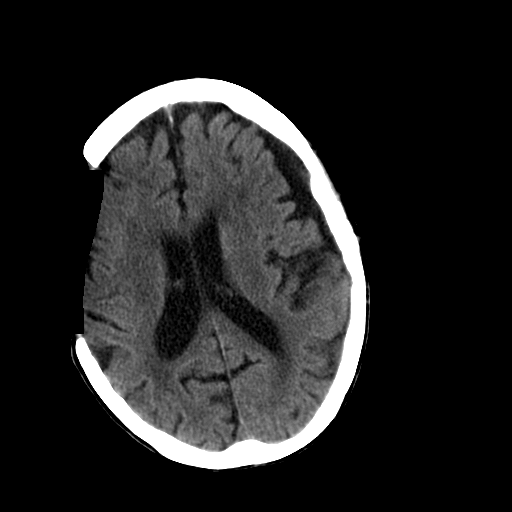
[im 28/44  bone]
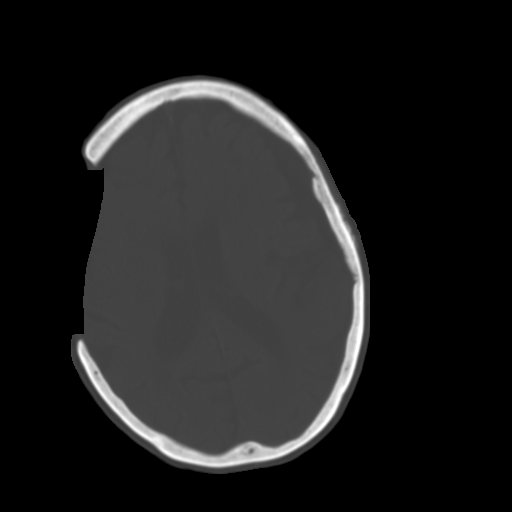
[im 31/44  brain]
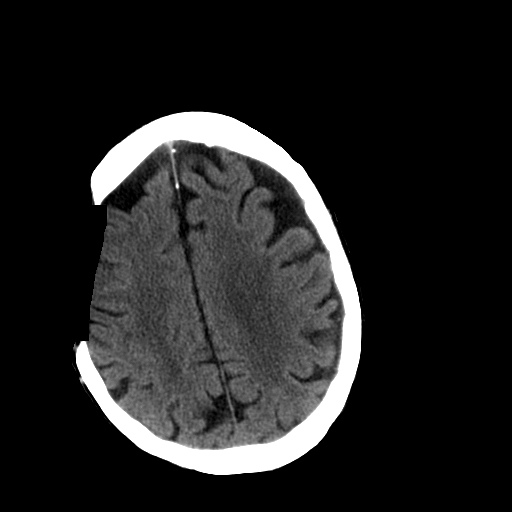
[im 34/44  brain]
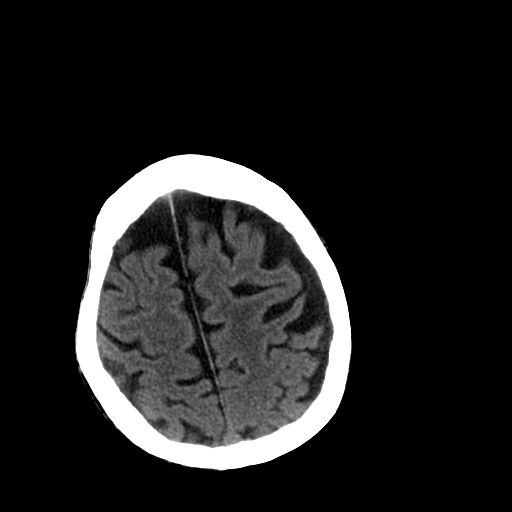
[im 37/44  brain]
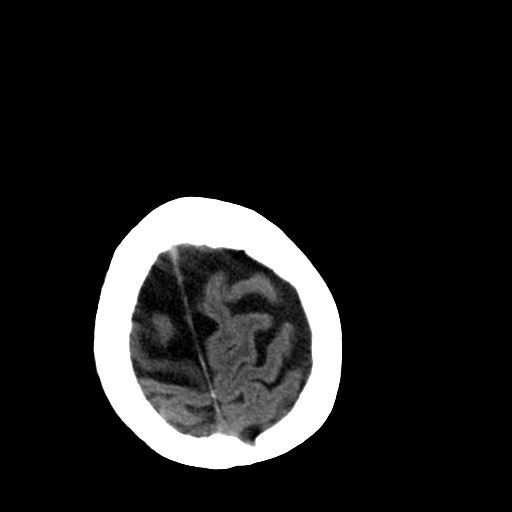
[im 40/44  brain]
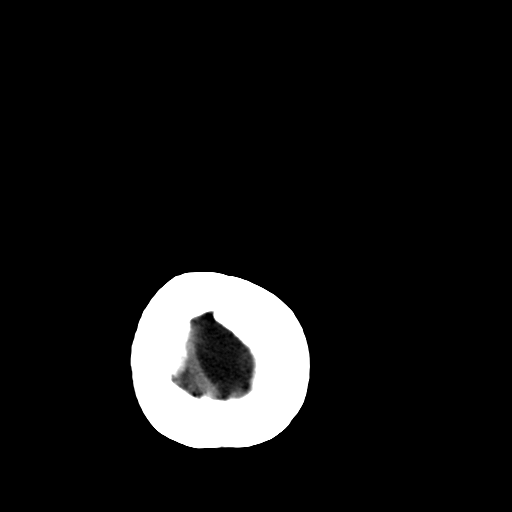
[im 40/44  bone]
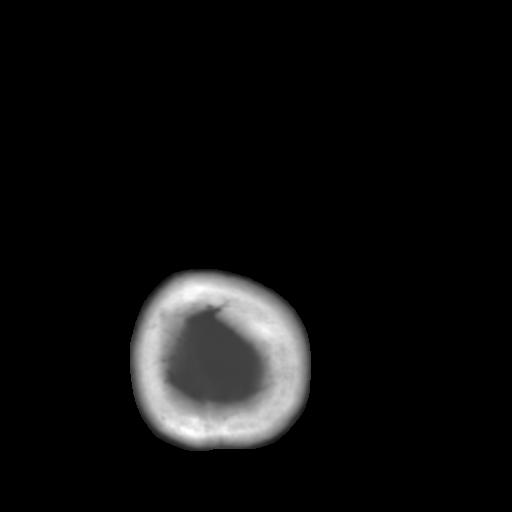

[Series 3: recon 2: brain · axial · 0.47mm/px · z∈[+120,+154]mm · 3 of 44 slices shown]
[im 4/44  brain]
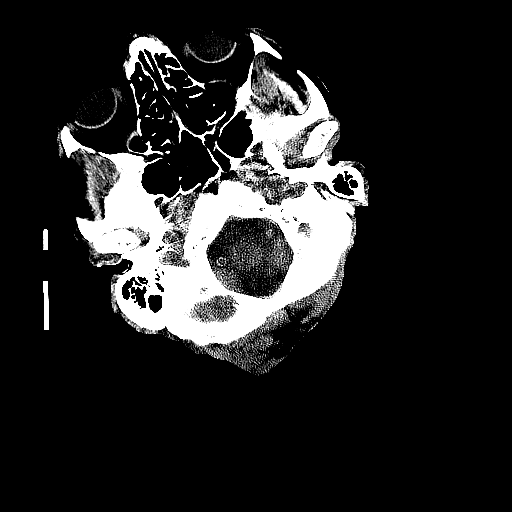
[im 10/44  brain]
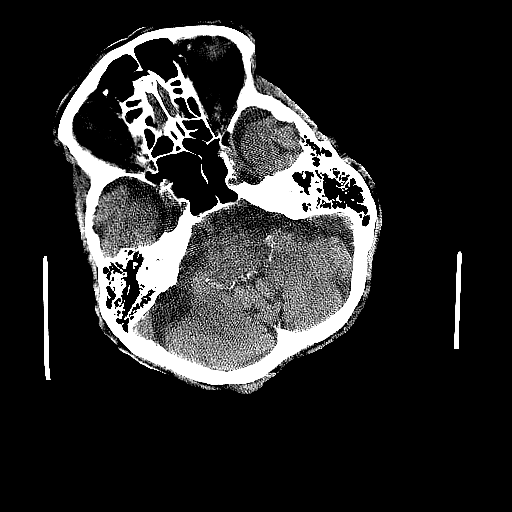
[im 16/44  brain]
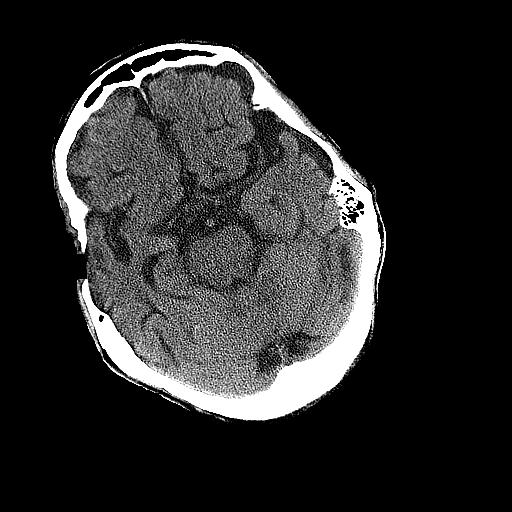

[16 of 30 positions shown; findings below may reference images not displayed]

FINDINGS: Sequelae of prior right craniectomy.  No acute findings
of the orbits are scalp. Visualized paranasal sinuses and mastoids
are clear.  No acute osseous abnormality identified.

Stable cerebral volume.  Stable ventricle size and configuration.
No midline shift, mass effect, or evidence of mass lesion.  No
acute intracranial hemorrhage identified.  Confluent cerebral white
matter hypodensity appears not significantly changed. No evidence
of cortically based acute infarction identified.  No suspicious
intracranial vascular hyperdensity.
IMPRESSION: Stable noncontrast CT appearance of the brain.

## 2013-04-07 ENCOUNTER — Encounter: Payer: Self-pay | Admitting: Nurse Practitioner

## 2013-04-14 ENCOUNTER — Encounter: Payer: Self-pay | Admitting: Nurse Practitioner

## 2013-07-14 ENCOUNTER — Non-Acute Institutional Stay (SKILLED_NURSING_FACILITY): Payer: Medicare Other | Admitting: Adult Health

## 2013-07-14 DIAGNOSIS — I10 Essential (primary) hypertension: Secondary | ICD-10-CM

## 2013-07-14 DIAGNOSIS — K219 Gastro-esophageal reflux disease without esophagitis: Secondary | ICD-10-CM

## 2013-07-14 DIAGNOSIS — F0150 Vascular dementia without behavioral disturbance: Secondary | ICD-10-CM

## 2013-07-25 ENCOUNTER — Encounter: Payer: Self-pay | Admitting: Adult Health

## 2013-07-25 DIAGNOSIS — K219 Gastro-esophageal reflux disease without esophagitis: Secondary | ICD-10-CM | POA: Insufficient documentation

## 2013-07-25 DIAGNOSIS — F0150 Vascular dementia without behavioral disturbance: Secondary | ICD-10-CM | POA: Insufficient documentation

## 2013-07-25 DIAGNOSIS — I10 Essential (primary) hypertension: Secondary | ICD-10-CM | POA: Insufficient documentation

## 2013-07-25 NOTE — Assessment & Plan Note (Signed)
Will continue nexium 40 mg daily  

## 2013-07-25 NOTE — Progress Notes (Signed)
Patient ID: Nancy [name], female   DOB: 06-Nov-1924, 79 y.o.   MRN: [id number]  [facility]  No Known Allergies   Chief Complaint  Patient presents with  . Medical Managment of Chronic Issues    HPI: She is being seen for the management of her chronic illnesses. There are no concerns being voiced by the nursing staff. Overall her status remains unchanged; she is unable to participate in the hpi or ros.   Past Medical History  Diagnosis Date  . Depression   . Hypertension   . GERD (gastroesophageal reflux disease)     History reviewed. No pertinent past surgical history.  VITAL SIGNS BP 108/56  Pulse 68  Ht 5' (1.524 m)  Wt 104 lb 12.8 oz (47.537 kg)  BMI 20.47 kg/m2   Patient's Medications  New Prescriptions   No medications on file  Previous Medications   ASPIRIN 81 MG TABLET    Take 81 mg by mouth daily.   BETA CAROTENE W/MINERALS (OCUVITE) TABLET    Take 1 tablet by mouth daily.   CHOLECALCIFEROL (VITAMIN D) 1000 UNITS TABLET    Take 2,000 Units by mouth daily.   DILTIAZEM (CARDIZEM CD) 180 MG 24 HR CAPSULE    Take 180 mg by mouth daily.   ESOMEPRAZOLE (NEXIUM) 40 MG CAPSULE    Take 40 mg by mouth daily before breakfast.   MEMANTINE HCL ER (NAMENDA XR) 28 MG CP24    Take 28 mg by mouth daily.   RIVASTIGMINE (EXELON) 9.5 MG/24HR    Place 1 patch onto the skin daily.   SERTRALINE (ZOLOFT) 50 MG TABLET    Take 50 mg by mouth daily.  Modified Medications   No medications on file  Discontinued Medications   No medications on file    SIGNIFICANT DIAGNOSTIC EXAMS   04-08-13: chest x-ray: borderline cardiomegaly no pulmonary vascur congestion or effusion. Old granulomatous disease. Mild bronchitis and chronic interstitial findings. And mid lung and perihilar regions.   LABS REVIEWED:   04-09-13: wbc 3.9; hgb 11.7; hct 35.6; mcv 82.8; plt 253; glucose 69; bun 20; creat 0.84; k+4.4;  Na++ 144 04-10-13: urine culture: k. Pneumoniae/e-coli: macrobid 04-26-13: vit d  32 05-27-13: wbc 5.6; hgb 11.4; hct 36.3; mcv 90.8 ;plt 213   Review of Systems  Unable to perform ROS   Physical Exam  Constitutional:  fral  Neck: Neck supple. No JVD present. No thyromegaly present.  Cardiovascular: Normal rate, regular rhythm and intact distal pulses.   Respiratory: Effort normal and breath sounds normal. No respiratory distress.  GI: Soft. Bowel sounds are normal. She exhibits no distension. There is no tenderness.  Musculoskeletal: She exhibits no edema.  Is able to move extremities   Neurological: She is alert.  Skin: Skin is warm.       ASSESSMENT/ PLAN:   Essential hypertension, benign Stable continue cardizem cd 180 mg daily  Asa 81 mg daily and will monitor  GERD (gastroesophageal reflux disease) Will continue nexium 40 mg daily   Vascular dementia Is without change in status; will continue exelon patch 9.5 mg daily and will continue namenda xr 28 mg daily     Time spent with patient 50 minutes

## 2013-07-25 NOTE — Assessment & Plan Note (Addendum)
Stable continue cardizem cd 180 mg daily  Asa 81 mg daily and will monitor

## 2013-07-25 NOTE — Assessment & Plan Note (Signed)
Is without change in status; will continue exelon patch 9.5 mg daily and will continue namenda xr 28 mg daily

## 2013-08-07 NOTE — Progress Notes (Signed)
This encounter was created in error - please disregard.

## 2013-09-12 ENCOUNTER — Encounter: Payer: Self-pay | Admitting: Nurse Practitioner

## 2013-09-12 ENCOUNTER — Non-Acute Institutional Stay (SKILLED_NURSING_FACILITY): Payer: Medicare Other | Admitting: Nurse Practitioner

## 2013-09-12 DIAGNOSIS — F0391 Unspecified dementia with behavioral disturbance: Secondary | ICD-10-CM

## 2013-09-12 DIAGNOSIS — F03918 Unspecified dementia, unspecified severity, with other behavioral disturbance: Secondary | ICD-10-CM

## 2013-09-12 DIAGNOSIS — R627 Adult failure to thrive: Secondary | ICD-10-CM

## 2013-09-12 DIAGNOSIS — R609 Edema, unspecified: Secondary | ICD-10-CM

## 2013-10-15 ENCOUNTER — Emergency Department (HOSPITAL_COMMUNITY)
Admission: EM | Admit: 2013-10-15 | Discharge: 2013-10-16 | Disposition: A | Discharge status: Home / Self Care | Payer: Medicare Other | Attending: Emergency Medicine | Admitting: Emergency Medicine

## 2013-10-15 ENCOUNTER — Encounter (HOSPITAL_COMMUNITY): Payer: Self-pay | Admitting: Emergency Medicine

## 2013-10-15 DIAGNOSIS — I10 Essential (primary) hypertension: Secondary | ICD-10-CM | POA: Insufficient documentation

## 2013-10-15 DIAGNOSIS — IMO0002 Reserved for concepts with insufficient information to code with codable children: Secondary | ICD-10-CM

## 2013-10-15 DIAGNOSIS — Y9389 Activity, other specified: Secondary | ICD-10-CM | POA: Insufficient documentation

## 2013-10-15 DIAGNOSIS — K219 Gastro-esophageal reflux disease without esophagitis: Secondary | ICD-10-CM | POA: Insufficient documentation

## 2013-10-15 DIAGNOSIS — Z23 Encounter for immunization: Secondary | ICD-10-CM | POA: Insufficient documentation

## 2013-10-15 DIAGNOSIS — Z7982 Long term (current) use of aspirin: Secondary | ICD-10-CM | POA: Insufficient documentation

## 2013-10-15 DIAGNOSIS — W230XXA Caught, crushed, jammed, or pinched between moving objects, initial encounter: Secondary | ICD-10-CM | POA: Insufficient documentation

## 2013-10-15 DIAGNOSIS — F0390 Unspecified dementia without behavioral disturbance: Secondary | ICD-10-CM | POA: Insufficient documentation

## 2013-10-15 DIAGNOSIS — Z87440 Personal history of urinary (tract) infections: Secondary | ICD-10-CM | POA: Insufficient documentation

## 2013-10-15 DIAGNOSIS — S81812A Laceration without foreign body, left lower leg, initial encounter: Secondary | ICD-10-CM

## 2013-10-15 DIAGNOSIS — Y9210 Unspecified residential institution as the place of occurrence of the external cause: Secondary | ICD-10-CM | POA: Insufficient documentation

## 2013-10-15 DIAGNOSIS — F3289 Other specified depressive episodes: Secondary | ICD-10-CM | POA: Insufficient documentation

## 2013-10-15 DIAGNOSIS — Z872 Personal history of diseases of the skin and subcutaneous tissue: Secondary | ICD-10-CM | POA: Insufficient documentation

## 2013-10-15 DIAGNOSIS — F329 Major depressive disorder, single episode, unspecified: Secondary | ICD-10-CM | POA: Insufficient documentation

## 2013-10-15 DIAGNOSIS — Z79899 Other long term (current) drug therapy: Secondary | ICD-10-CM | POA: Insufficient documentation

## 2013-10-15 DIAGNOSIS — S81009A Unspecified open wound, unspecified knee, initial encounter: Secondary | ICD-10-CM | POA: Insufficient documentation

## 2013-10-15 HISTORY — DX: Urinary tract infection, site not specified: N39.0

## 2013-10-15 HISTORY — DX: Traumatic subdural hemorrhage with loss of consciousness of unspecified duration, initial encounter: S06.5X9A

## 2013-10-15 HISTORY — DX: Rhabdomyolysis: M62.82

## 2013-10-15 HISTORY — DX: Reserved for inherently not codable concepts without codable children: IMO0001

## 2013-10-15 HISTORY — DX: Altered mental status, unspecified: R41.82

## 2013-10-15 HISTORY — DX: Dysphagia, unspecified: R13.10

## 2013-10-15 HISTORY — DX: Traumatic subdural hemorrhage with loss of consciousness status unknown, initial encounter: S06.5XAA

## 2013-10-15 MED ORDER — TETANUS-DIPHTH-ACELL PERTUSSIS 5-2.5-18.5 LF-MCG/0.5 IM SUSP
0.5000 mL | Freq: Once | INTRAMUSCULAR | Status: AC
  Administered 2013-10-15: 0.5 mL via INTRAMUSCULAR
  Filled 2013-10-15: qty 0.5

## 2013-10-15 NOTE — ED Notes (Signed)
Report called to [facility], [facility] called.  Spoke with [employer].

## 2013-10-15 NOTE — ED Notes (Signed)
Patient presents from [facility] via EMS.  Staff reported to EMS that she was getting out of bed and caught the back of her left calf on a screw that was sticking out in the bed.  Approx 6" laceration to the calf of her left leg, bleeding controlled with dressing.  Also has a small laceration to her right calf in the shape of a U.

## 2013-10-15 NOTE — ED Provider Notes (Signed)
CSN: [id number]     Arrival date & time 10/15/13  2051 History  This chart was scribed for non-physician practitioner Trixie Dredge, PA, working with Ethelda Chick, MD by Ronal Fear, ED scribe. This patient was seen in room TR11C/TR11C and the patient's care was started at 9:29 PM.    Chief Complaint  Patient presents with  . Extremity Laceration   The history is provided by the patient. No language interpreter was used.  HPI Comments: Nancy [name] is a 79 y.o. female with a hx of advanced dementia who presents to the Emergency Department with her son complaining of a sudden onset  laceration to her left lower leg. the Pt was brought from [facility] by the EMS after she was being moved to her bed and her left lower leg was cut. Pt has had a prior occurrence to her right leg Tuesday.  She did not fall, she did not hit her head per EMS/nursing home. Son states patient is at her baseline.    Level V caveat for dementia.   Past Medical History  Diagnosis Date  . Depression   . Hypertension   . GERD (gastroesophageal reflux disease)    No past surgical history on file. No family history on file. History  Substance Use Topics  . Smoking status: Never Smoker   . Smokeless tobacco: Not on file  . Alcohol Use: Not on file   OB History   Grav Para Term Preterm Abortions TAB SAB Ect Mult Living                 Review of Systems  Unable to perform ROS: Dementia  Skin: Positive for wound.    Allergies  Review of patient's allergies indicates no known allergies.  Home Medications   Current Outpatient Rx  Name  Route  Sig  Dispense  Refill  . aspirin 81 MG tablet   Oral   Take 81 mg by mouth daily.         . beta carotene w/minerals (OCUVITE) tablet   Oral   Take 1 tablet by mouth daily.         . cholecalciferol (VITAMIN D) 1000 UNITS tablet   Oral   Take 2,000 Units by mouth daily.         Marland Kitchen diltiazem (CARDIZEM CD) 180 MG 24 hr capsule   Oral   Take 180 mg by mouth  daily.         Marland Kitchen esomeprazole (NEXIUM) 40 MG capsule   Oral   Take 40 mg by mouth daily before breakfast.         . sertraline (ZOLOFT) 50 MG tablet   Oral   Take 50 mg by mouth daily.          BP 104/70  Pulse 88  Temp(Src) 97.5 F (36.4 C) (Axillary)  Resp 20  Ht 5\' 5"  (1.651 m)  Wt 90 lb (40.824 kg)  BMI 14.98 kg/m2  SpO2 95% Physical Exam  Nursing note and vitals reviewed. Constitutional: She appears well-developed and well-nourished. No distress.  HENT:  Head: Normocephalic and atraumatic.  Neck: Neck supple.  Cardiovascular: Normal rate and regular rhythm.   DP intact  Pulmonary/Chest: Effort normal and breath sounds normal. She has no wheezes. She has no rales.  Abdominal: Soft. She exhibits no distension. There is no tenderness.  Musculoskeletal:  BLE with pitting edema. Right medial lower leg with skin tear and eccymosis. Left lateral calf with laceration approximately  13 cm, hemostatic. Nontender.   Neurological: She is alert.  Skin: She is not diaphoretic.    ED Course  Procedures (including critical care time) DIAGNOSTIC STUDIES: Oxygen Saturation is 95% on RA, adequate by my interpretation.    COORDINATION OF CARE:   9:35 PM- Pt advised of plan for treatment including numbing and suturing the wound and pt agrees.   Labs Review Labs Reviewed - No data to display Imaging Review No results found.  EKG Interpretation   None      LACERATION REPAIR Performed by: Trixie Dredge B Authorized by: Trixie Dredge B Consent: Verbal consent obtained. Risks and benefits: risks, benefits and alternatives were discussed Consent given by: patient Patient identity confirmed: provided demographic data Prepped and Draped in normal sterile fashion Wound explored  Laceration Location: left lower leg  Laceration Length: 13 cm  No Foreign Bodies seen or palpated  Anesthesia: local infiltration  Local anesthetic: lidocaine 2% no epinephrine  Anesthetic  total: 10 ml  Irrigation method: syringe Amount of cleaning: standard  Skin closure: 4-0 ethilon and steri strip to support thin skin  Number of sutures: 15  Technique: simple interrupted  Patient tolerance: Patient tolerated the procedure well with no immediate complications.    MDM   1. Laceration of lower extremity, left, initial encounter   2. Skin tear    Pt with advanced dementia brought in by son after suffering laceration to left lower leg while nursing home staff was transferring her today.  Laceration repaired in ED.  Tetanus updated.  Pt d/c back to facility.  Return precautions given.   I personally performed the services described in this documentation, which was scribed in my presence. The recorded information has been reviewed and is accurate.   Trixie Dredge, PA-C 10/15/13 2319  Trixie Dredge, PA-C 10/15/13 2320

## 2013-10-15 NOTE — ED Notes (Signed)
Patient presents with U shaped laceration to right calf which is not bleeding and was dressing.  Son states that this happened on Tuesday.  Left calf has approx 6" laceration that happened when the staff was getting her back in bed.  Son states that she has not walked for about 4 years.

## 2013-10-15 NOTE — ED Provider Notes (Signed)
Medical screening examination/treatment/procedure(s) were performed by non-physician practitioner and as supervising physician I was immediately available for consultation/collaboration.  EKG Interpretation   None        Ethelda Chick, MD 10/15/13 [phone]

## 2013-10-15 NOTE — ED Notes (Signed)
Wounds cleaned and dressings applied.  15 sutures applied to left leg

## 2013-12-06 NOTE — Progress Notes (Signed)
      Patient ID: Nancy [name], female   DOB: 1924/07/04, 80 y.o.   MRN: [id number]  This encounter was created in error - please disregard.

## 2013-12-06 NOTE — Progress Notes (Addendum)
Date of Visit 09/03/2013  [facility]   Rm 303 A  CODE STATUS DO NOT RESUSCITATE  Patient ID: Nancy [name], female   DOB: Jan 20, 1924, 79 y.o.   MRN: [id number]  Allergies  Allergen Reactions  . Macrobid [Nitrofurantoin]     unsure    Chief Complaint  Patient presents with  . Acute Visit   HPI:  Asked to see pt by nursing staff, r/t poor sleeping.  State that pt yells out at night and disturbs other residents.  State that the pt has not shown any signs of acute illness.    Review of Systems  Constitutional: Negative for fever, chills and diaphoresis.  HENT: Negative for ear discharge, ear pain and nosebleeds.   Eyes: Negative for discharge and redness.  Respiratory: Negative for hemoptysis.   Cardiovascular: Positive for leg swelling.  Gastrointestinal: Negative for blood in stool.  Genitourinary: Negative for hematuria.  Psychiatric/Behavioral: Positive for depression and hallucinations (Patient yells out at night, disturbing other residents. Unable to sleep through the night.).    Past Medical History  Diagnosis Date  . Depression   . Hypertension   . GERD (gastroesophageal reflux disease)   . Dysphagia   . Rhabdomyolysis   . UTI (lower urinary tract infection)   . Chronic ulcer   . Altered mental status   . Subdural hematoma     Past Surgical History  Procedure Laterality Date  . Cholecystectomy     Current Outpatient Prescriptions on File Prior to Visit  Medication Sig Dispense Refill  . aspirin 81 MG tablet Take 81 mg by mouth daily.      . beta carotene w/minerals (OCUVITE) tablet Take 1 tablet by mouth daily.      . cholecalciferol (VITAMIN D) 1000 UNITS tablet Take 2,000 Units by mouth daily.      Marland Kitchen diltiazem (CARDIZEM CD) 180 MG 24 hr capsule Take 180 mg by mouth daily.      Marland Kitchen esomeprazole (NEXIUM) 40 MG capsule Take 40 mg by mouth daily before breakfast.      . sertraline (ZOLOFT) 50 MG tablet Take 50 mg by mouth daily.       No current  facility-administered medications on file prior to visit.   Labs 05/27/2013 WBC 5.6 RBC 4 Hemoglobin 11.4 Hematocrit 36.3 MCV 90.8 MC H. 28.5 MCHC 31.4 RDW 17.2 Platelets 243 Neutrophil #3.2 Lymphocyte #1.6  BP 99/68  Pulse 77  Temp(Src) 97.9 F (36.6 C)  Resp 16  SpO2 98%  Physical Exam  Constitutional: No distress.  Very thin  HENT:  Head: Normocephalic and atraumatic.  Right Ear: External ear normal.  Left Ear: External ear normal.  Dentures in place  Eyes: Conjunctivae and EOM are normal. Pupils are equal, round, and reactive to light. Right eye exhibits no discharge. Left eye exhibits no discharge. No scleral icterus.  Positive for arcus senilis and red reflex.  Neck: No thyromegaly present.  Cardiovascular: Normal rate and regular rhythm.   Pulmonary/Chest: Effort normal and breath sounds normal.  Abdominal: Soft. Bowel sounds are normal.  Musculoskeletal: She exhibits edema.  The patient has significant pedal edema, easily 3+. Question of rubor. The patient is wearing TED hose, but sits with her feet pain down most of the day.  Lymphadenopathy:    She has no cervical adenopathy.  Neurological: She is alert.  The patient is oriented to self, she is conversant, but she is unable to answer simple questions accurately.  Skin: There is erythema.  Question of rubor versus just erythema, of the feet, with legs in a dependent position.  Psychiatric: She has a normal mood and affect.  Patient yelling through the night   Assessment/plan:  Behavioral disturbance  Behavioral disturbance, patient on 25 mg of Zoloft, will increase to 50 mg and assess response.  Hypertension, continue the Cardizem CD at 180 mg per day, will continue to monitor as the patient's blood pressure tends to run on the low side.  Also continue aspirin at 81 mg. We'll continue to monitor and adjust medications as necessary.  Edema, will defer diuretic at this time. The patient continuously sits  with her legs in the dependent position which would make response to diuretic very difficult. We will obtain a BMP.  Failure to thrive, the patient gets Magic cup at all meals to provide extra calories and protein. We'll continue to monitor  GERD, will continue the Nexium at 40 mg per day, will continue to monitor  Supplementation, will continue her vitamin D 3 at 2000 units per day, and Ocuvite at 1 per day. The patient previously had anemia, but now, blood counts were within normal limits. We'll continue to monitor    CORRECT DATE OF VISIT IS 09/12/2013

## 2014-01-03 ENCOUNTER — Non-Acute Institutional Stay (SKILLED_NURSING_FACILITY): Payer: Medicare Other | Admitting: Adult Health

## 2014-01-03 DIAGNOSIS — I10 Essential (primary) hypertension: Secondary | ICD-10-CM

## 2014-01-03 DIAGNOSIS — F329 Major depressive disorder, single episode, unspecified: Secondary | ICD-10-CM

## 2014-01-03 DIAGNOSIS — F32A Depression, unspecified: Secondary | ICD-10-CM

## 2014-01-03 DIAGNOSIS — F3289 Other specified depressive episodes: Secondary | ICD-10-CM

## 2014-01-03 DIAGNOSIS — F0150 Vascular dementia without behavioral disturbance: Secondary | ICD-10-CM

## 2014-01-03 DIAGNOSIS — K219 Gastro-esophageal reflux disease without esophagitis: Secondary | ICD-10-CM

## 2014-01-03 DIAGNOSIS — E559 Vitamin D deficiency, unspecified: Secondary | ICD-10-CM

## 2014-01-05 ENCOUNTER — Encounter: Payer: Self-pay | Admitting: Adult Health

## 2014-01-05 DIAGNOSIS — F0150 Vascular dementia without behavioral disturbance: Secondary | ICD-10-CM

## 2014-01-05 DIAGNOSIS — F32A Depression, unspecified: Secondary | ICD-10-CM | POA: Insufficient documentation

## 2014-01-05 DIAGNOSIS — F329 Major depressive disorder, single episode, unspecified: Secondary | ICD-10-CM | POA: Insufficient documentation

## 2014-01-05 DIAGNOSIS — E559 Vitamin D deficiency, unspecified: Secondary | ICD-10-CM | POA: Insufficient documentation

## 2014-01-05 HISTORY — DX: Vascular dementia, unspecified severity, without behavioral disturbance, psychotic disturbance, mood disturbance, and anxiety: F01.50

## 2014-01-05 NOTE — Progress Notes (Signed)
Patient ID: Nancy [name], female   DOB: 03/02/1924, 80 y.o.   MRN: [id number]     [facility]  Allergies  Allergen Reactions  . Macrobid [Nitrofurantoin]     unsure     Chief Complaint  Patient presents with  . Medical Managment of Chronic Issues    HPI:  She is being seen for the management of her chronic illnesses. Overall there is no significant change in her status. There are no concerns being voiced by the nursing staff at this time. She is unable to participate in the hpi or ros.   Past Medical History  Diagnosis Date  . Depression   . Hypertension   . GERD (gastroesophageal reflux disease)   . Dysphagia   . Rhabdomyolysis   . UTI (lower urinary tract infection)   . Chronic ulcer   . Altered mental status   . Subdural hematoma     Past Surgical History  Procedure Laterality Date  . Cholecystectomy      VITAL SIGNS BP 115/68  Pulse 96  Ht 5' (1.524 m)  Wt 105 lb 6.4 oz (47.809 kg)  BMI 20.58 kg/m2   Patient's Medications  New Prescriptions   No medications on file  Previous Medications   ACETAMINOPHEN (TYLENOL) 325 MG TABLET    Take 650 mg by mouth every 6 (six) hours as needed for pain (pain).   ASPIRIN 81 MG TABLET    Take 81 mg by mouth daily.   BETA CAROTENE W/MINERALS (OCUVITE) TABLET    Take 1 tablet by mouth daily.   CHOLECALCIFEROL (VITAMIN D) 1000 UNITS TABLET    Take 2,000 Units by mouth daily.   DILTIAZEM (CARDIZEM CD) 180 MG 24 HR CAPSULE    Take 180 mg by mouth daily.   ESOMEPRAZOLE (NEXIUM) 40 MG CAPSULE    Take 40 mg by mouth daily before breakfast.   MEMANTINE HCL ER (NAMENDA XR) 28 MG CP24    Take 28 mg by mouth daily.   RIVASTIGMINE (EXELON) 13.3 MG/24HR PT24    Place 13.3 mg onto the skin daily.   SERTRALINE (ZOLOFT) 50 MG TABLET    Take 50 mg by mouth daily.  Modified Medications   No medications on file  Discontinued Medications   MEMANTINE (NAMENDA) 10 MG TABLET    Take 10 mg by mouth 2 (two) times daily.    SIGNIFICANT  DIAGNOSTIC EXAMS   04-08-13: chest x-ray: borderline cardiomegaly no pulmonary vascur congestion or effusion. Old granulomatous disease. Mild bronchitis and chronic interstitial findings. And mid lung and perihilar regions.     LABS REVIEWED:   04-09-13: wbc 3.9; hgb 11.7; hct 35.6; mcv 82.8; plt 253; glucose 69; bun 20; creat 0.84; k+4.4;  Na++ 144 04-10-13: urine culture: k. Pneumoniae/e-coli: macrobid 04-26-13: vit d 32 05-27-13: wbc 5.6; hgb 11.4; hct 36.3; mcv 90.8 ;plt 213  09-14-13: glucose 82; bun 19; creat 0.9; k+4.3; na++ 140;  10-19-13:vit d 24.9 11-28-13: wbc 5.9; hgb 13.0; hct 41.7; mcv 87.6; plt 243     Review of Systems  Unable to perform ROS   Physical Exam  Constitutional:  fral  Neck: Neck supple. No JVD present. No thyromegaly present.  Cardiovascular: Normal rate, regular rhythm and intact distal pulses.   Respiratory: Effort normal and breath sounds normal. No respiratory distress.  GI: Soft. Bowel sounds are normal. She exhibits no distension. There is no tenderness.  Musculoskeletal: She exhibits no edema.  Is able to move extremities   Neurological: She  is alert.  Skin: Skin is warm.    ASSESSMENT/ PLAN:  1. Hypertension; is stable will continue cardizem cd 180 mg daily and will continue asa 81 mg daily will monitor   2. Gerd: will continue nexium 40 mg daily   3. Depression: is emotionally stable will continue zoloft 50 mg daily and will monitor her status   4. Vit d deficiency: will continue vit d 200 units daily   5. Dementia; no recent significant change: will continue exelon patch 13.3 mg daily will continue namenda xr 28 mg daily and will monitor her status.

## 2014-02-08 ENCOUNTER — Non-Acute Institutional Stay (SKILLED_NURSING_FACILITY): Payer: Medicare Other | Admitting: Adult Health

## 2014-02-08 DIAGNOSIS — F329 Major depressive disorder, single episode, unspecified: Secondary | ICD-10-CM

## 2014-02-08 DIAGNOSIS — F32A Depression, unspecified: Secondary | ICD-10-CM

## 2014-02-08 DIAGNOSIS — E559 Vitamin D deficiency, unspecified: Secondary | ICD-10-CM

## 2014-02-08 DIAGNOSIS — K219 Gastro-esophageal reflux disease without esophagitis: Secondary | ICD-10-CM

## 2014-02-08 DIAGNOSIS — F0150 Vascular dementia without behavioral disturbance: Secondary | ICD-10-CM

## 2014-02-08 DIAGNOSIS — F3289 Other specified depressive episodes: Secondary | ICD-10-CM

## 2014-02-08 DIAGNOSIS — I10 Essential (primary) hypertension: Secondary | ICD-10-CM

## 2014-02-13 ENCOUNTER — Encounter: Payer: Self-pay | Admitting: Adult Health

## 2014-02-13 MED ORDER — DILTIAZEM HCL 30 MG PO TABS: 30.0000 mg | ORAL_TABLET | Freq: Three times a day (TID) | ORAL | Status: DC

## 2014-02-13 NOTE — Progress Notes (Signed)
Patient ID: Nancy [name], female   DOB: 1924-03-29, 80 y.o.   MRN: [id number]     [facility]  Allergies  Allergen Reactions  . Macrobid [Nitrofurantoin]     unsure    CODE STATUS: DNR    Chief Complaint  Patient presents with  . Annual Exam    HPI:  She is being seen for her annual exam. Overall her status has slightly declined; she is not as able to participate in her adl's as she had been in the past. She is unable to participate in the hpi or ros. She is not an appropriate candidate for a screening mammogram; colonoscopy; and she is unable to participate with a bone density.    Past Medical History  Diagnosis Date  . Depression   . Hypertension   . GERD (gastroesophageal reflux disease)   . Dysphagia   . Rhabdomyolysis   . UTI (lower urinary tract infection)   . Chronic ulcer   . Altered mental status   . Subdural hematoma     Past Surgical History  Procedure Laterality Date  . Cholecystectomy      VITAL SIGNS BP 99/79  Pulse 62  Ht 5' (1.524 m)  Wt 105 lb (47.628 kg)  BMI 20.51 kg/m2   Patient's Medications  New Prescriptions   No medications on file  Previous Medications   ACETAMINOPHEN (TYLENOL) 325 MG TABLET    Take 650 mg by mouth every 6 (six) hours as needed for pain (pain).   ASPIRIN 81 MG TABLET    Take 81 mg by mouth daily.   BETA CAROTENE W/MINERALS (OCUVITE) TABLET    Take 1 tablet by mouth daily.   CHOLECALCIFEROL (VITAMIN D) 1000 UNITS TABLET    Take 2,000 Units by mouth daily.   DILTIAZEM (CARDIZEM CD) 180 MG 24 HR CAPSULE    Take 180 mg by mouth daily.   ESOMEPRAZOLE (NEXIUM) 40 MG CAPSULE    Take 40 mg by mouth daily before breakfast.   MEMANTINE HCL ER (NAMENDA XR) 28 MG CP24    Take 28 mg by mouth daily.   RIVASTIGMINE (EXELON) 13.3 MG/24HR PT24    Place 13.3 mg onto the skin daily.   SERTRALINE (ZOLOFT) 50 MG TABLET    Take 50 mg by mouth daily.  Modified Medications   No medications on file  Discontinued Medications   No  medications on file    SIGNIFICANT DIAGNOSTIC EXAMS   04-08-13: chest x-ray: borderline cardiomegaly no pulmonary vascur congestion or effusion. Old granulomatous disease. Mild bronchitis and chronic interstitial findings. And mid lung and perihilar regions.     LABS REVIEWED:   04-09-13: wbc 3.9; hgb 11.7; hct 35.6; mcv 82.8; plt 253; glucose 69; bun 20; creat 0.84; k+4.4;  Na++ 144 04-10-13: urine culture: k. Pneumoniae/e-coli: macrobid 04-26-13: vit d 32 05-27-13: wbc 5.6; hgb 11.4; hct 36.3; mcv 90.8 ;plt 213  09-14-13: glucose 82; bun 19; creat 0.9; k+4.3; na++ 140;  10-19-13:vit d 24.9 11-28-13: wbc 5.9; hgb 13.0; hct 41.7; mcv 87.6; plt 243     Review of Systems  Unable to perform ROS   Physical Exam  Constitutional:  fral  Neck: Neck supple. No JVD present. No thyromegaly present.  Cardiovascular: Normal rate, regular rhythm and intact distal pulses.   Respiratory: Effort normal and breath sounds normal. No respiratory distress.  GI: Soft. Bowel sounds are normal. She exhibits no distension. There is no tenderness.  Musculoskeletal: She exhibits no edema.  Is able  to move extremities   Neurological: She is alert.  Skin: Skin is warm.    ASSESSMENT/ PLAN:  1. Hypertension; her blood pressure has been running low; will continue asa 81 mg daily  will stop the cardizem cd and will begin diltiazem 30 mg every 8 hours and will hold for a systolic blood pressure <100 and will monitor her status.   2. Genella RifeGerd: will continue nexium 40 mg daily   3. Depression: is emotionally stable will continue zoloft 50 mg daily she has been on this therapy long term  will monitor her status   4. Vit d deficiency: will continue vit d 200 units daily   5. Dementia; no recent significant change: will continue exelon patch 13.3 mg daily will continue namenda xr 28 mg daily and will monitor her status.      Synthia Innocenteborah Wylie Coon NP [facility]  Contact [phone] Monday through  Friday 8am- 5pm  After hours call [phone]

## 2014-03-06 ENCOUNTER — Encounter: Payer: Self-pay | Admitting: Internal Medicine

## 2014-03-06 ENCOUNTER — Non-Acute Institutional Stay (SKILLED_NURSING_FACILITY): Payer: Medicare Other | Admitting: Internal Medicine

## 2014-03-06 DIAGNOSIS — F32A Depression, unspecified: Secondary | ICD-10-CM

## 2014-03-06 DIAGNOSIS — F3289 Other specified depressive episodes: Secondary | ICD-10-CM

## 2014-03-06 DIAGNOSIS — E559 Vitamin D deficiency, unspecified: Secondary | ICD-10-CM

## 2014-03-06 DIAGNOSIS — I10 Essential (primary) hypertension: Secondary | ICD-10-CM

## 2014-03-06 DIAGNOSIS — K219 Gastro-esophageal reflux disease without esophagitis: Secondary | ICD-10-CM

## 2014-03-06 DIAGNOSIS — F329 Major depressive disorder, single episode, unspecified: Secondary | ICD-10-CM

## 2014-03-06 DIAGNOSIS — F0150 Vascular dementia without behavioral disturbance: Secondary | ICD-10-CM

## 2014-03-06 NOTE — Progress Notes (Signed)
Patient ID: Nancy PatrickLeigh G [name], female   DOB: 06/06/1924, 80 y.o.   MRN: [id number]    [facility]  Chief Complaint  Patient presents with  . Medical Managment of Chronic Issues   Allergies  Allergen Reactions  . Macrobid [Nitrofurantoin]     unsure   HPI 80 y/o female patient is seen for routine visit. She is in her bed, in no acute distress and is confused. She is unable to provide any history or ROS. No new concerns from staff. No falls reported. No skin concerns. No recent fever or complaints of pain.  ROS Unable to obtain  Past Medical History  Diagnosis Date  . Depression   . Hypertension   . GERD (gastroesophageal reflux disease)   . Dysphagia   . Rhabdomyolysis   . UTI (lower urinary tract infection)   . Chronic ulcer   . Altered mental status   . Subdural hematoma    Past Surgical History  Procedure Laterality Date  . Cholecystectomy     Current Outpatient Prescriptions on File Prior to Visit  Medication Sig Dispense Refill  . acetaminophen (TYLENOL) 325 MG tablet Take 650 mg by mouth every 6 (six) hours as needed for pain (pain).      Marland Kitchen. aspirin 81 MG tablet Take 81 mg by mouth daily.      . beta carotene w/minerals (OCUVITE) tablet Take 1 tablet by mouth daily.      . cholecalciferol (VITAMIN D) 1000 UNITS tablet Take 2,000 Units by mouth daily.      Marland Kitchen. diltiazem (CARDIZEM) 30 MG tablet Take 1 tablet (30 mg total) by mouth 3 (three) times daily.  90 tablet  11  . esomeprazole (NEXIUM) 40 MG capsule Take 40 mg by mouth daily before breakfast.      . Memantine HCl ER (NAMENDA XR) 28 MG CP24 Take 28 mg by mouth daily.      . Rivastigmine (EXELON) 13.3 MG/24HR PT24 Place 13.3 mg onto the skin daily.      . sertraline (ZOLOFT) 50 MG tablet Take 50 mg by mouth daily.       No current facility-administered medications on file prior to visit.    Physical exam BP 150/87  Pulse 97  Temp(Src) 98.2 F (36.8 C)  Resp 18  SpO2 96%  Constitutional: frail Neck:  Neck supple. No JVD present. No thyromegaly present.  Cardiovascular: Normal rate, regular rhythm and intact distal pulses.   Respiratory: Effort normal and breath sounds normal. No respiratory distress.  GI: Soft. Bowel sounds are normal. She exhibits no distension. There is no tenderness.  Musculoskeletal: She exhibits no edema. Is able to move her extremities. Right arm skin tear present, tegraderm on it Neurological: She is alert.  Skin: Skin is warm.   Labs 04-09-13: wbc 3.9; hgb 11.7; hct 35.6; mcv 82.8; plt 253; glucose 69; bun 20; creat 0.84; k+4.4;   Na++ 144 04-10-13: urine culture: k. Pneumoniae/e-coli: macrobid 04-26-13: vit d 32 05-27-13: wbc 5.6; hgb 11.4; hct 36.3; mcv 90.8 ;plt 213   09-14-13: glucose 82; bun 19; creat 0.9; k+4.3; na++ 140;   10-19-13:vit d 24.9 11-28-13: wbc 5.9; hgb 13.0; hct 41.7; mcv 87.6; plt 243    Assessment/plan  Dementia - continue namenda xr. Pt has been removing her exelon patch. Thus will discontinue it. Monitor clinically. Continue zoloft for her mood   GERD- continue nexium 40 mg daily   Depression- continue zoloft 50 mg daily. Monitor memory and mood  Hypertension- SBP slightly elevated. Continue cardizem 30 mg tid for now. Continue aspirin. Monitor bp readings. Check bmp  Vitamin d def- continue vitamin d supplement. Fall precautions. Check vitamin d level   Labs- cbc, bmp, vitamin d  Oneal Grout, MD  [facility] [phone] (Monday-Friday 8 am - 5 pm) [phone] (afterhours)

## 2014-03-07 LAB — BASIC METABOLIC PANEL
BUN: 30 mg/dL — AB (ref 4–21)
CREATININE: 0.6 mg/dL (ref 0.5–1.1)
GLUCOSE: 77 mg/dL
POTASSIUM: 4.4 mmol/L (ref 3.4–5.3)
Sodium: 140 mmol/L (ref 137–147)

## 2014-03-10 ENCOUNTER — Non-Acute Institutional Stay (SKILLED_NURSING_FACILITY): Payer: Medicare Other | Admitting: Adult Health

## 2014-03-10 DIAGNOSIS — I10 Essential (primary) hypertension: Secondary | ICD-10-CM

## 2014-03-10 DIAGNOSIS — N390 Urinary tract infection, site not specified: Secondary | ICD-10-CM

## 2014-03-10 DIAGNOSIS — E559 Vitamin D deficiency, unspecified: Secondary | ICD-10-CM

## 2014-03-13 ENCOUNTER — Encounter: Payer: Self-pay | Admitting: Adult Health

## 2014-03-13 DIAGNOSIS — N390 Urinary tract infection, site not specified: Secondary | ICD-10-CM | POA: Insufficient documentation

## 2014-03-13 MED ORDER — VITAMIN D (ERGOCALCIFEROL) 1.25 MG (50000 UNIT) PO CAPS: 50000.0000 [IU] | ORAL_CAPSULE | ORAL | Status: DC

## 2014-03-13 NOTE — Progress Notes (Signed)
Patient ID: Nancy [name], female   DOB: 1924-02-14, 80 y.o.   MRN: [id number]     [facility]  Allergies  Allergen Reactions  . Macrobid [Nitrofurantoin]     unsure     Chief Complaint  Patient presents with  . Acute Visit    vaginal bleeding     HPI:  Nursing staff reports that there has been vaginal bleed present for the past couple of days. There have been times when she has had large amount of bleeding present. The is unable to fully participate in the hpi or ros. Nursing staff also report that her urine is foul.   Past Medical History  Diagnosis Date  . Depression   . Hypertension   . GERD (gastroesophageal reflux disease)   . Dysphagia   . Rhabdomyolysis   . UTI (lower urinary tract infection)   . Chronic ulcer   . Altered mental status   . Subdural hematoma     Past Surgical History  Procedure Laterality Date  . Cholecystectomy      VITAL SIGNS BP 100/60  Pulse 73  Ht 5' (1.524 m)  Wt 105 lb (47.628 kg)  BMI 20.51 kg/m2   Patient's Medications  New Prescriptions   No medications on file  Previous Medications   ACETAMINOPHEN (TYLENOL) 325 MG TABLET    Take 650 mg by mouth every 6 (six) hours as needed for pain (pain).   ASPIRIN 81 MG TABLET    Take 81 mg by mouth daily.   BETA CAROTENE W/MINERALS (OCUVITE) TABLET    Take 1 tablet by mouth daily.   CHOLECALCIFEROL (VITAMIN D) 1000 UNITS TABLET    Take 2,000 Units by mouth daily.   DILTIAZEM (CARDIZEM) 30 MG TABLET    Take 1 tablet (30 mg total) by mouth 3 (three) times daily.   ESOMEPRAZOLE (NEXIUM) 40 MG CAPSULE    Take 40 mg by mouth daily before breakfast.   MEMANTINE HCL ER (NAMENDA XR) 28 MG CP24    Take 28 mg by mouth daily.   RIVASTIGMINE (EXELON) 13.3 MG/24HR PT24    Place 13.3 mg onto the skin daily.   SERTRALINE (ZOLOFT) 50 MG TABLET    Take 50 mg by mouth daily.  Modified Medications   No medications on file  Discontinued Medications   No medications on file    SIGNIFICANT DIAGNOSTIC  EXAMS   04-08-13: chest x-ray: borderline cardiomegaly no pulmonary vascur congestion or effusion. Old granulomatous disease. Mild bronchitis and chronic interstitial findings. And mid lung and perihilar regions.     LABS REVIEWED:   04-09-13: wbc 3.9; hgb 11.7; hct 35.6; mcv 82.8; plt 253; glucose 69; bun 20; creat 0.84; k+4.4;  Na++ 144 04-10-13: urine culture: k. Pneumoniae/e-coli: macrobid 04-26-13: vit d 32 05-27-13: wbc 5.6; hgb 11.4; hct 36.3; mcv 90.8 ;plt 213  09-14-13: glucose 82; bun 19; creat 0.9; k+4.3; na++ 140;  10-19-13:vit d 24.9 11-28-13: wbc 5.9; hgb 13.0; hct 41.7; mcv 87.6; plt 243  03-07-14: wbc 5.7 hgb 12.3 hct 39.8 ;mcv 87.5. plt 236; glucose 77; bun 20; creat 0.6; k+4.4; na++ 140; vit d 16.42     Review of Systems  Unable to perform ROS   Physical Exam  Constitutional:  fral  Neck: Neck supple. No JVD present. No thyromegaly present.  Cardiovascular: Normal rate, regular rhythm and intact distal pulses.   Respiratory: Effort normal and breath sounds normal. No respiratory distress.  GI: Soft. Bowel sounds are normal. She exhibits no  distension.supratenderness tenderness  Unable to perform a detailed vaginal exam; some old blood present.  Musculoskeletal: She exhibits no edema.  Is able to move extremities   Neurological: She is alert.  Skin: Skin is warm.       ASSESSMENT/ PLAN:  1. Vaginal bleeding: more than likely this is related to an uti; however; will be safe and will hold her asa at this time; will guaiac her stool X3; will get a ua/c&s and will monitor her status   2. Hypertension: will stop her Cardizem and will monitor her status   3. Vit d deficiency: will begin vit d 50,000 units for 3 months then 2 000 units daily will monitor          Synthia Innocenteborah Dallin Mccorkel NP [facility]  Contact [phone] Monday through Friday 8am- 5pm  After hours call [phone]

## 2014-04-05 ENCOUNTER — Non-Acute Institutional Stay (SKILLED_NURSING_FACILITY): Payer: Medicare Other | Admitting: Adult Health

## 2014-04-05 ENCOUNTER — Encounter: Payer: Self-pay | Admitting: Adult Health

## 2014-04-05 DIAGNOSIS — F3289 Other specified depressive episodes: Secondary | ICD-10-CM

## 2014-04-05 DIAGNOSIS — F329 Major depressive disorder, single episode, unspecified: Secondary | ICD-10-CM

## 2014-04-05 DIAGNOSIS — I10 Essential (primary) hypertension: Secondary | ICD-10-CM

## 2014-04-05 DIAGNOSIS — F0150 Vascular dementia without behavioral disturbance: Secondary | ICD-10-CM

## 2014-04-05 DIAGNOSIS — K219 Gastro-esophageal reflux disease without esophagitis: Secondary | ICD-10-CM

## 2014-04-05 DIAGNOSIS — F32A Depression, unspecified: Secondary | ICD-10-CM

## 2014-04-05 DIAGNOSIS — E559 Vitamin D deficiency, unspecified: Secondary | ICD-10-CM

## 2014-04-05 NOTE — Progress Notes (Signed)
Patient ID: Nancy [name], female   DOB: 08/25/1924, 80 y.o.   MRN: [id number]     [facility]  Allergies  Allergen Reactions  . Macrobid [Nitrofurantoin]     unsure     Chief Complaint  Patient presents with  . Medical Management of Chronic Issues    HPI:  She is being seen for the management of her chronic illnesses. She has been treated for an uti this past month. There are no concerns being voiced by the nursing staff at this time. She is unable to participate in the hpi or ros.    Past Medical History  Diagnosis Date  . Depression   . Hypertension   . GERD (gastroesophageal reflux disease)   . Dysphagia   . Rhabdomyolysis   . UTI (lower urinary tract infection)   . Chronic ulcer   . Altered mental status   . Subdural hematoma     Past Surgical History  Procedure Laterality Date  . Cholecystectomy      VITAL SIGNS BP 122/53  Pulse 64  Ht 5\' 4"  (1.626 m)  Wt 104 lb 9.6 oz (47.446 kg)  BMI 17.95 kg/m2   Patient's Medications  New Prescriptions   No medications on file  Previous Medications   ACETAMINOPHEN (TYLENOL) 325 MG TABLET    Take 650 mg by mouth every 6 (six) hours as needed for pain (pain).   ASPIRIN 81 MG TABLET    Take 81 mg by mouth daily.   BETA CAROTENE W/MINERALS (OCUVITE) TABLET    Take 1 tablet by mouth daily.   CHOLECALCIFEROL (VITAMIN D) 1000 UNITS TABLET    Take 4,000 Units by mouth daily.    ESOMEPRAZOLE (NEXIUM) 40 MG CAPSULE    Take 40 mg by mouth daily before breakfast.   MEMANTINE HCL ER (NAMENDA XR) 28 MG CP24    Take 28 mg by mouth daily.   SERTRALINE (ZOLOFT) 50 MG TABLET    Take 50 mg by mouth daily.  Modified Medications   No medications on file  Discontinued Medications   DILTIAZEM (CARDIZEM) 30 MG TABLET    Take 1 tablet (30 mg total) by mouth 3 (three) times daily.   RIVASTIGMINE (EXELON) 13.3 MG/24HR PT24    Place 13.3 mg onto the skin daily.   VITAMIN D, ERGOCALCIFEROL, (DRISDOL) 50000 UNITS CAPS CAPSULE    Take 1  capsule (50,000 Units total) by mouth every 7 (seven) days.    SIGNIFICANT DIAGNOSTIC EXAMS   04-08-13: chest x-ray: borderline cardiomegaly no pulmonary vascur congestion or effusion. Old granulomatous disease. Mild bronchitis and chronic interstitial findings. And mid lung and perihilar regions.     LABS REVIEWED:   04-26-13: vit d 32 05-27-13: wbc 5.6; hgb 11.4; hct 36.3; mcv 90.8 ;plt 213  09-14-13: glucose 82; bun 19; creat 0.9; k+4.3; na++ 140;  10-19-13:vit d 24.9 11-28-13: wbc 5.9; hgb 13.0; hct 41.7; mcv 87.6; plt 243  03-07-14: wbc 5.7; hgb 12.3; hct 39.8 ;mcv 87.5; plt 236; glucose 77; bun 30; creat 0.6; k+4.4; na++140 vit d 16.42  03-10-14: wbc 4.9; hgb 13.0; hct 42.1; mcv 87.5; plt 237 urine culture; klebsiella pneumoniae: levaquin     Review of Systems  Unable to perform ROS   Physical Exam  Constitutional:  fral  Neck: Neck supple. No JVD present. No thyromegaly present.  Cardiovascular: Normal rate, regular rhythm and intact distal pulses.   Respiratory: Effort normal and breath sounds normal. No respiratory distress.  GI: Soft. Bowel sounds  are normal. She exhibits no distension. There is no tenderness.  Musculoskeletal: She exhibits no edema.  Is able to move extremities   Neurological: She is alert.  Skin: Skin is warm.    ASSESSMENT/ PLAN:  1. Hypertension;she is presently not taking medication to lower her blood pressure; will monitor her status and  will continue asa 81 mg daily     2. Gerd: will continue nexium 40 mg daily   3. Depression: is emotionally stable will continue zoloft 50 mg daily she has been on this therapy long term  will monitor her status   4. Vit d deficiency: will continue vit d 4000 units daily   5. Dementia; no recent significant change: will  continue namenda xr 28 mg daily and will monitor her status. Her exelon patch was stopped as she pulled the patch off.          Synthia Innocenteborah Jasman Pfeifle NP [facility]  Contact  [phone] Monday through Friday 8am- 5pm  After hours call [phone]

## 2014-04-18 LAB — VITAMIN D 1,25 DIHYDROXY: VITAMIN D 1, 25 (OH) TOTAL: 37.41

## 2014-05-04 ENCOUNTER — Non-Acute Institutional Stay (SKILLED_NURSING_FACILITY): Payer: Medicare Other | Admitting: Adult Health

## 2014-05-04 DIAGNOSIS — F0150 Vascular dementia without behavioral disturbance: Secondary | ICD-10-CM

## 2014-05-04 DIAGNOSIS — E559 Vitamin D deficiency, unspecified: Secondary | ICD-10-CM

## 2014-05-04 DIAGNOSIS — F3289 Other specified depressive episodes: Secondary | ICD-10-CM

## 2014-05-04 DIAGNOSIS — K219 Gastro-esophageal reflux disease without esophagitis: Secondary | ICD-10-CM

## 2014-05-04 DIAGNOSIS — F329 Major depressive disorder, single episode, unspecified: Secondary | ICD-10-CM

## 2014-05-04 DIAGNOSIS — F32A Depression, unspecified: Secondary | ICD-10-CM

## 2014-05-04 DIAGNOSIS — I10 Essential (primary) hypertension: Secondary | ICD-10-CM

## 2014-05-29 ENCOUNTER — Encounter: Payer: Self-pay | Admitting: Adult Health

## 2014-05-29 LAB — CBC AND DIFFERENTIAL
HCT: 37 % (ref 36–46)
HEMOGLOBIN: 11.5 g/dL — AB (ref 12.0–16.0)
Platelets: 218 10*3/uL (ref 150–399)
WBC: 5.9 10^3/mL

## 2014-05-29 MED ORDER — VITAMIN D 1000 UNITS PO TABS: 2000.0000 [IU] | ORAL_TABLET | Freq: Every day | ORAL | Status: AC

## 2014-05-29 MED ORDER — RANITIDINE HCL 150 MG PO TABS: 150.0000 mg | ORAL_TABLET | Freq: Every day | ORAL | Status: DC

## 2014-05-29 NOTE — Progress Notes (Signed)
Patient ID: Nancy [name], female   DOB: 01/15/1924, 80 y.o.   MRN: [id number]     [facility]  Allergies  Allergen Reactions  . Macrobid [Nitrofurantoin]     unsure     Chief Complaint  Patient presents with  . Medical Management of Chronic Issues    HPI:  She is being seen for the management of her chronic illnesses. Overall there is little change in her status. There are no concerns being voiced by the nursing staff at this time. She is unable to fully participate in the hpi or ros.    Past Medical History  Diagnosis Date  . Depression   . Hypertension   . GERD (gastroesophageal reflux disease)   . Dysphagia   . Rhabdomyolysis   . UTI (lower urinary tract infection)   . Chronic ulcer   . Altered mental status   . Subdural hematoma     Past Surgical History  Procedure Laterality Date  . Cholecystectomy      VITAL SIGNS BP 106/68  Pulse 58  Ht 5\' 1"  (1.549 m)  Wt 101 lb (45.813 kg)  BMI 19.09 kg/m2   Patient's Medications  New Prescriptions   No medications on file  Previous Medications   ACETAMINOPHEN (TYLENOL) 325 MG TABLET    Take 650 mg by mouth every 6 (six) hours as needed for pain (pain).   ASPIRIN 81 MG TABLET    Take 81 mg by mouth daily.   BETA CAROTENE W/MINERALS (OCUVITE) TABLET    Take 1 tablet by mouth daily.   CHOLECALCIFEROL (VITAMIN D) 1000 UNITS TABLET    Take 4,000 Units by mouth daily.    ESOMEPRAZOLE (NEXIUM) 40 MG CAPSULE    Take 40 mg by mouth daily before breakfast.   MEMANTINE HCL ER (NAMENDA XR) 28 MG CP24    Take 28 mg by mouth daily.   SERTRALINE (ZOLOFT) 50 MG TABLET    Take 50 mg by mouth daily.  Modified Medications   No medications on file  Discontinued Medications   No medications on file    SIGNIFICANT DIAGNOSTIC EXAMS   04-08-13: chest x-ray: borderline cardiomegaly no pulmonary vascur congestion or effusion. Old granulomatous disease. Mild bronchitis and chronic interstitial findings. And mid lung and perihilar  regions.     LABS REVIEWED:   05-27-13: wbc 5.6; hgb 11.4; hct 36.3; mcv 90.8 ;plt 213  09-14-13: glucose 82; bun 19; creat 0.9; k+4.3; na++ 140;  10-19-13:vit d 24.9 11-28-13: wbc 5.9; hgb 13.0; hct 41.7; mcv 87.6; plt 243  03-07-14: wbc 5.7; hgb 12.3; hct 39.8 ;mcv 87.5; plt 236; glucose 77; bun 30; creat 0.6; k+4.4; na++140 vit d 16.42  03-10-14: wbc 4.9; hgb 13.0; hct 42.1; mcv 87.5; plt 237 urine culture; klebsiella pneumoniae: levaquin  04-18-14: vit d 37.41      Review of Systems  Unable to perform ROS   Physical Exam  Constitutional:  fral  Neck: Neck supple. No JVD present. No thyromegaly present.  Cardiovascular: Normal rate, regular rhythm and intact distal pulses.   Respiratory: Effort normal and breath sounds normal. No respiratory distress.  GI: Soft. Bowel sounds are normal. She exhibits no distension. There is no tenderness.  Musculoskeletal: She exhibits no edema.  Is able to move extremities   Neurological: She is alert.  Skin: Skin is warm.    ASSESSMENT/ PLAN:  1. Hypertension;she is presently not taking medication to lower her blood pressure; will monitor her status and  will continue  asa 81 mg daily     2. Gerd: will stop the nexium and will begin zantac 150 mg nightly will monitor   3. Depression: is emotionally stable will continue zoloft 50 mg daily she has been on this therapy long term  will monitor her status   4. Vit d deficiency: will change vit d to 200 units daily   5. Dementia; no recent significant change: will  continue namenda xr 28 mg daily and will monitor her status. Her exelon patch was stopped as she pulled the patch off.          Synthia Innocenteborah Green NP [facility]  Contact [phone] Monday through Friday 8am- 5pm  After hours call [phone]

## 2014-06-06 ENCOUNTER — Non-Acute Institutional Stay (SKILLED_NURSING_FACILITY): Payer: Medicare Other | Admitting: Adult Health

## 2014-06-06 DIAGNOSIS — E559 Vitamin D deficiency, unspecified: Secondary | ICD-10-CM

## 2014-06-06 DIAGNOSIS — F0150 Vascular dementia without behavioral disturbance: Secondary | ICD-10-CM

## 2014-06-06 DIAGNOSIS — F3289 Other specified depressive episodes: Secondary | ICD-10-CM

## 2014-06-06 DIAGNOSIS — I10 Essential (primary) hypertension: Secondary | ICD-10-CM

## 2014-06-06 DIAGNOSIS — K219 Gastro-esophageal reflux disease without esophagitis: Secondary | ICD-10-CM

## 2014-06-06 DIAGNOSIS — F329 Major depressive disorder, single episode, unspecified: Secondary | ICD-10-CM

## 2014-06-06 DIAGNOSIS — F32A Depression, unspecified: Secondary | ICD-10-CM

## 2014-06-14 ENCOUNTER — Encounter: Payer: Self-pay | Admitting: Adult Health

## 2014-06-14 NOTE — Progress Notes (Signed)
Patient ID: Nancy [name], female   DOB: 09/13/1924, 80 y.o.   MRN: [id number]     [facility]  Allergies  Allergen Reactions  . Macrobid [Nitrofurantoin]     unsure     Chief Complaint  Patient presents with  . Medical Management of Chronic Issues    HPI:  She is being seen for the management of her chronic illnesses. There have been no significant change in her overall status. There are no concerns being voiced by the nursing staff at this time. She is unable to fully participate in the hpi or ros.    Past Medical History  Diagnosis Date  . Depression   . Hypertension   . GERD (gastroesophageal reflux disease)   . Dysphagia   . Rhabdomyolysis   . UTI (lower urinary tract infection)   . Chronic ulcer   . Altered mental status   . Subdural hematoma     Past Surgical History  Procedure Laterality Date  . Cholecystectomy      VITAL SIGNS BP 123/61  Pulse 89  Ht 5' (1.524 m)  Wt 103 lb (46.72 kg)  BMI 20.12 kg/m2  SpO2 97%   Patient's Medications  New Prescriptions   No medications on file  Previous Medications   ACETAMINOPHEN (TYLENOL) 325 MG TABLET    Take 650 mg by mouth every 6 (six) hours as needed for pain (pain).   ASPIRIN 81 MG TABLET    Take 81 mg by mouth daily.   BETA CAROTENE W/MINERALS (OCUVITE) TABLET    Take 1 tablet by mouth daily.   CHOLECALCIFEROL (VITAMIN D) 1000 UNITS TABLET    Take 2 tablets (2,000 Units total) by mouth daily.   MEMANTINE HCL ER (NAMENDA XR) 28 MG CP24    Take 28 mg by mouth daily.   RANITIDINE (ZANTAC) 150 MG TABLET    Take 1 tablet (150 mg total) by mouth at bedtime.   SERTRALINE (ZOLOFT) 50 MG TABLET    Take 50 mg by mouth daily.  Modified Medications   No medications on file  Discontinued Medications   No medications on file    SIGNIFICANT DIAGNOSTIC EXAMS    04-08-13: chest x-ray: borderline cardiomegaly no pulmonary vascur congestion or effusion. Old granulomatous disease. Mild bronchitis and chronic  interstitial findings. And mid lung and perihilar regions.     LABS REVIEWED:   09-14-13: glucose 82; bun 19; creat 0.9; k+4.3; na++ 140;  10-19-13:vit d 24.9 11-28-13: wbc 5.9; hgb 13.0; hct 41.7; mcv 87.6; plt 243  03-07-14: wbc 5.7; hgb 12.3; hct 39.8 ;mcv 87.5; plt 236; glucose 77; bun 30; creat 0.6; k+4.4; na++140 vit d 16.42  03-10-14: wbc 4.9; hgb 13.0; hct 42.1; mcv 87.5; plt 237 urine culture; klebsiella pneumoniae: levaquin  04-18-14: vit d 37.41  05-29-14: wbc 5.9; hgb 11.5; hct 37.2; mcv 90.7; plt 218      Review of Systems  Unable to perform ROS   Physical Exam  Constitutional:  fral  Neck: Neck supple. No JVD present. No thyromegaly present.  Cardiovascular: Normal rate, regular rhythm and intact distal pulses.   Respiratory: Effort normal and breath sounds normal. No respiratory distress.  GI: Soft. Bowel sounds are normal. She exhibits no distension. There is no tenderness.  Musculoskeletal: She exhibits no edema.  Is able to move extremities   Neurological: She is alert.  Skin: Skin is warm.    ASSESSMENT/ PLAN:  1. Hypertension;she is presently stable; not taking medication to lower her  blood pressure; will monitor her status and  will continue asa 81 mg daily     2. Gerd: will continue zantac 150 mg daily and will monitor    3. Depression: is emotionally stable will continue zoloft 50 mg daily she has been on this therapy long term  will monitor her status   4. Vit d deficiency: will change vit d to 2000 units daily   5. Dementia; no recent significant change: will  continue namenda xr 28 mg daily and will monitor her status. Her exelon patch was stopped as she pulled the patch off.          Synthia Innocenteborah Green NP [facility]  Contact [phone] Monday through Friday 8am- 5pm  After hours call [phone]

## 2014-07-05 ENCOUNTER — Encounter: Payer: Self-pay | Admitting: Adult Health

## 2014-07-05 ENCOUNTER — Non-Acute Institutional Stay (SKILLED_NURSING_FACILITY): Payer: Medicare Other | Admitting: Adult Health

## 2014-07-05 DIAGNOSIS — E559 Vitamin D deficiency, unspecified: Secondary | ICD-10-CM

## 2014-07-05 DIAGNOSIS — R609 Edema, unspecified: Secondary | ICD-10-CM

## 2014-07-05 DIAGNOSIS — I4891 Unspecified atrial fibrillation: Secondary | ICD-10-CM | POA: Insufficient documentation

## 2014-07-05 DIAGNOSIS — F0150 Vascular dementia without behavioral disturbance: Secondary | ICD-10-CM

## 2014-07-05 NOTE — Progress Notes (Signed)
Patient ID: Nancy [name], female   DOB: Jul 21, 1924, 80 y.o.   MRN: [id number]   [facility] (31)  Code Status:  DNR  Chief Complaint  Patient presents with  . Medical Management of Chronic Issues    HPI:  This is an 80 y.o. Female with a hx of dementia living at [facility]. She is pleasantly confused and propels herself through the facility in her WC. I am here to review her chronic issues. There are no complaints per the staff. Her weight has decreased by 3lb over the past 3 months to 101 lbs.    Allergies  Allergen Reactions  . Macrobid [Nitrofurantoin]     unsure    MEDICATIONS -  Reviewed Current outpatient prescriptions:acetaminophen (TYLENOL) 325 MG tablet, Take 650 mg by mouth every 6 (six) hours as needed for pain (pain)., Disp: , Rfl: ;  aspirin 81 MG tablet, Take 81 mg by mouth daily., Disp: , Rfl: ;  beta carotene w/minerals (OCUVITE) tablet, Take 1 tablet by mouth daily., Disp: , Rfl: ;  cholecalciferol (VITAMIN D) 1000 UNITS tablet, Take 2 tablets (2,000 Units total) by mouth daily., Disp: 90 tablet, Rfl: 11 Memantine HCl ER (NAMENDA XR) 28 MG CP24, Take 28 mg by mouth daily., Disp: , Rfl: ;  ranitidine (ZANTAC) 150 MG tablet, Take 1 tablet (150 mg total) by mouth at bedtime., Disp: 30 tablet, Rfl: 11;  sertraline (ZOLOFT) 50 MG tablet, Take 50 mg by mouth daily., Disp: , Rfl:  DATA REVIEWED  Radiologic Exams  Cardiovascular Exams  2D echo from 2010  1. Left ventricle: The cavity size was normal. There was mild concentric hypertrophy. Systolic function was normal. Wall motion was normal; there were no regional wall motion abnormalities. 2. Aortic valve: There was very mild stenosis. Mild regurgitation. Valve area: 1.24cm^2(VTI). Valve area: 1.22cm^2 (Vmax). 3. Mitral valve: Calcified annulus. Moderate regurgitation. 4. Left atrium: The atrium was mildly dilated. 5. Pulmonary arteries: PA peak pressure: 34mm Hg (S).  Laboratory  Studies 10-19-13:vit d 24.9  11-28-13: wbc 5.9; hgb 13.0; hct 41.7; mcv 87.6; plt 243  03-07-14: wbc 5.7; hgb 12.3; hct 39.8 ;mcv 87.5; plt 236; glucose 77; bun 30; creat 0.6; k+4.4; na++140 vit d 16.42  03-10-14: wbc 4.9; hgb 13.0; hct 42.1; mcv 87.5; plt 237 urine culture; klebsiella pneumoniae: levaquin  04-18-14: vit d 37.41  05-29-14: wbc 5.9; hgb 11.5; hct 37.2; mcv 90.7; plt 218   REVIEW OF SYSTEMS  Unable to complete due to dementia. Denies pain but can not elaborate further for review  PHYSICAL EXAM Filed Vitals:   07/05/14 1614  BP: 108/79  Pulse: 81  Temp: 98.6 F (37 C)  Resp: 20  Weight: 101 lb (45.813 kg)   Body mass index is 19.73 kg/(m^2). GENERAL APPEARANCE: No acute distress, appropriately groomed, frail body habitus Alert, pleasant, conversant. SKIN: No diaphoresis, rash, wound HEAD: Normocephalic, atraumatic EYES: Conjunctiva/lids clear  RESPIRATORY: Breathing is even, unlabored  Lung sounds are clear and full  CARDIOVASCULAR: Heart IRRR   No murmur or extra heart sounds   EDEMA:+2 pitting edema to BLE, bluish purple color. Difficult to palpate pedal pulses secondary to edema MUSCULOSKELETAL.  Decreased ROM to both knees R>L, strength 3/5 to BLE, 4/5 to BUE PSYCHIATRIC: Mood and affect appropriate to situation. Oriented to self only.  ASSESSMENT/PLAN   Vitamin D deficiency Currently on 2000 units Vit D daily. Last level 37.4. Continue current dose and monitor level q6 mo  A-fib Found  in 2011, was on Digoxin. Not on anticoagulation due to h/o GIB and high fall risk. She is on ASA and tolerating well. Her rate is controlled without meds. Continue to monitor.  Vascular dementia without behavioral disturbance Her functional status is stable. [facility]. She was on an exelon patch but she kept removing it so it was discontinued. She is on namenda continue as tolerated.   Edema Dependent edema noted to both legs that  appears to be new in review of previous assessments. Will add TED hose and check a CMP and BNP in the am. She has no respiratory or cardiac symptoms at this time but has a hx of venous ulcers that could be prevented with compression.   Wyatt Portelaebbie Kylle Lall NP/Christina Wert RN, MSN  07/05/2014

## 2014-07-05 NOTE — Assessment & Plan Note (Addendum)
Found in 2011, was on Digoxin. Not on anticoagulation due to h/o GIB and high fall risk. She is on ASA and tolerating well. Her rate is controlled without meds. Continue to monitor.

## 2014-07-05 NOTE — Assessment & Plan Note (Signed)
Currently on 2000 units Vit D daily. Last level 37.4. Continue current dose and monitor level q6 mo

## 2014-07-05 NOTE — Assessment & Plan Note (Signed)
Her functional status is stable. [facility]. She was on an exelon patch but she kept removing it so it was discontinued. She is on namenda continue as tolerated.

## 2014-07-06 DIAGNOSIS — R609 Edema, unspecified: Secondary | ICD-10-CM | POA: Insufficient documentation

## 2014-07-06 NOTE — Assessment & Plan Note (Signed)
Dependent edema noted to both legs that appears to be new in review of previous assessments. Will add TED hose and check a CMP and BNP in the am. She has no respiratory or cardiac symptoms at this time but has a hx of venous ulcers that could be prevented with compression.

## 2014-07-31 ENCOUNTER — Non-Acute Institutional Stay (SKILLED_NURSING_FACILITY): Payer: Medicare Other | Admitting: Internal Medicine

## 2014-07-31 ENCOUNTER — Encounter: Payer: Self-pay | Admitting: Internal Medicine

## 2014-07-31 DIAGNOSIS — F0150 Vascular dementia without behavioral disturbance: Secondary | ICD-10-CM

## 2014-07-31 DIAGNOSIS — K219 Gastro-esophageal reflux disease without esophagitis: Secondary | ICD-10-CM

## 2014-07-31 DIAGNOSIS — I10 Essential (primary) hypertension: Secondary | ICD-10-CM

## 2014-07-31 NOTE — Progress Notes (Signed)
Patient ID: Nancy [name], female   DOB: 01/29/1924, 80 y.o.   MRN: [id number]    Facility[facility]   DNR  Chief Complaint  Patient presents with  . Medical Management of Chronic Issues   Allergies  Allergen Reactions  . Macrobid [Nitrofurantoin]     unsure   HPI [age] y/o female pt is seen for RV. She denies any complaints this visit. No new concern from staff. Difficult to obtain HPI and ROS from patient. She has history of afib, vit d def, HTN, reflux disease Her zoloft has been decreased to 25 mg daily recently and is tolerating this fine. On mechanical soft diet and Magic cup tid  ROS Unable to obtain  Past Medical History  Diagnosis Date  . Depression   . Hypertension   . GERD (gastroesophageal reflux disease)   . Dysphagia   . Rhabdomyolysis   . UTI (lower urinary tract infection)   . Chronic ulcer   . Altered mental status   . Subdural hematoma   . Myocardial infarction   . Ulcer   . GI bleed    Medication reviewed. [facility]  Physical exam BP 100/60  Pulse 80  Temp(Src) 97.8 F (36.6 C)  Resp 18  SpO2 99%  Constitutional: elderly frail female in no acute distress Neck: Neck supple. No thyromegaly present.  Cardiovascular: Normal rate, regular rhythm and intact distal pulses.   Respiratory: Effort normal and breath sounds normal. No respiratory distress.  GI: Soft. Bowel sounds are normal. She exhibits no distension. There is no tenderness.  Musculoskeletal: She can move all 4 extremities and exhibits no edema.  Neurological: She is alert.  Skin: Skin is warm.    ASSESSMENT/ PLAN:  Dementia Has mix of vascular and alzhimer. Continue namenda for now. Provide assistance with ADLs, skin care, monitor po intake. Continue aspirin  Hypertension continue asa 81 mg daily. Off all bp meds, bp normal  Gerd Continue ranitidine 150 mg daily

## 2014-09-05 ENCOUNTER — Encounter: Payer: Self-pay | Admitting: Adult Health Nurse Practitioner

## 2014-09-05 ENCOUNTER — Non-Acute Institutional Stay (SKILLED_NURSING_FACILITY): Payer: Medicare Other | Admitting: Adult Health

## 2014-09-05 DIAGNOSIS — I10 Essential (primary) hypertension: Secondary | ICD-10-CM

## 2014-09-05 DIAGNOSIS — F32A Depression, unspecified: Secondary | ICD-10-CM

## 2014-09-05 DIAGNOSIS — E559 Vitamin D deficiency, unspecified: Secondary | ICD-10-CM

## 2014-09-05 DIAGNOSIS — K219 Gastro-esophageal reflux disease without esophagitis: Secondary | ICD-10-CM

## 2014-09-05 DIAGNOSIS — R609 Edema, unspecified: Secondary | ICD-10-CM

## 2014-09-05 DIAGNOSIS — F329 Major depressive disorder, single episode, unspecified: Secondary | ICD-10-CM

## 2014-09-05 DIAGNOSIS — F3289 Other specified depressive episodes: Secondary | ICD-10-CM

## 2014-09-05 NOTE — Progress Notes (Signed)
PCP: Oneal Grout, MD  Code Status: DNR  Allergies  Allergen Reactions  . Macrobid [Nitrofurantoin]     unsure    Chief Complaint: Medical Management of Chronic Health Issues  HPI:  Nancy [name] is an 80 yr old female being seen today for a routine visit.  She is unable to participate in ROS and assessment due to dementia. She does has some lower leg edema that is increasing. She does not appear short of breath and denies any chest pain. Staff reports no new concerns.  Review of Systems:  Unable to assess     Past Medical History  Diagnosis Date  . Depression   . Hypertension   . GERD (gastroesophageal reflux disease)   . Dysphagia   . Rhabdomyolysis   . UTI (lower urinary tract infection)   . Chronic ulcer   . Altered mental status   . Subdural hematoma   . Myocardial infarction   . Ulcer   . GI bleed    Past Surgical History  Procedure Laterality Date  . Cholecystectomy    . Hernia repair     Social History:   reports that she has never smoked. She has never used smokeless tobacco. She reports that she does not drink alcohol or use illicit drugs.  History reviewed. No pertinent family history.  Medications:   Medication List       This list is accurate as of: 09/05/14 11:28 AM.  Always use your most recent med list.               acetaminophen 325 MG tablet  Commonly known as:  TYLENOL  Take 650 mg by mouth every 6 (six) hours as needed for pain (pain).     aspirin 81 MG tablet  Take 81 mg by mouth daily.     beta carotene w/minerals tablet  Take 1 tablet by mouth daily.     cholecalciferol 1000 UNITS tablet  Commonly known as:  VITAMIN D  Take 2 tablets (2,000 Units total) by mouth daily.     NAMENDA XR 28 MG Cp24  Generic drug:  Memantine HCl ER  Take 28 mg by mouth daily.     ranitidine 150 MG tablet  Commonly known as:  ZANTAC  Take 1 tablet (150 mg total) by mouth at bedtime.     sertraline 50 MG tablet  Commonly known as:   ZOLOFT  Take 50 mg by mouth daily.          Physical Exam:  Filed Vitals:   09/05/14 1116  BP: 158/77  Pulse: 87  Temp: 98 F (36.7 C)  Resp: 18  Weight: 100 lb 9.6 oz (45.632 kg)  SpO2: 95%    General- elderly female in no acute distress; frail Neck- no lymphadenopathy, no thyromegaly, no jugular vein distension, no carotid bruit Cardiovascular- normal s1,s2, no murmurs/ rubs/ gallops; bilateral lower leg edema, 2+ pitting with poor circulation Respiratory- bilateral clear to auscultation, no wheeze, no rhonchi, no crackles, no use of accessory muscles Abdomen- bowel sounds present, soft, non tender, no organomegaly, no abdominal bruits, no guarding or rigidity Musculoskeletal- able to move all 4 extremities,  Wheelchair bound Neurological- decreased focal deficit, decreased mm strength Skin- warm and dry/thin; purple discoloration to lower extremities, blanches with palpation, 1+ dorsal pedal pulses Psychiatry- alert and pleasantly confused    Labs reviewed: CBC:  Recent Labs  05/29/14  WBC 5.9  HGB 11.5*  HCT 37  PLT 218  SIGNIFICANT DIAGNOSTIC EXAMS    04-08-13: chest x-ray: borderline cardiomegaly no pulmonary vascur congestion or effusion. Old granulomatous disease. Mild bronchitis and chronic interstitial findings. And mid lung and perihilar regions.     LABS REVIEWED:   09-14-13: glucose 82; bun 19; creat 0.9; k+4.3; na++ 140;  10-19-13:vit d 24.9 11-28-13: wbc 5.9; hgb 13.0; hct 41.7; mcv 87.6; plt 243  03-07-14: wbc 5.7; hgb 12.3; hct 39.8 ;mcv 87.5; plt 236; glucose 77; bun 30; creat 0.6; k+4.4; na++140 vit d 16.42  03-10-14: wbc 4.9; hgb 13.0; hct 42.1; mcv 87.5; plt 237 urine culture; klebsiella pneumoniae: levaquin  04-18-14: vit d 37.41  05-29-14: wbc 5.9; hgb 11.5; hct 37.2; mcv 90.7; plt 218   Assessment/Plan   1. Hypertension:  stable; no medications at this time will monitor her status and  will continue asa 81 mg daily     2. GERD:  stable; will continue zantac 150 mg daily and will monitor    3. Depression: is emotionally stable; will continue zoloft 25 mg daily   4. Vit D deficiency: no issues; will continue Vit D 2000 units daily   5. Dementia: no concerns; appears stable: will  continue namenda xr 28 mg daily and will monitor her status.   6. Leg edema: Concern for PVD/PAD; will start lasix  daily for 5 days; son refuses TED hose; will follow up  Nancy [name], Student-NP

## 2014-09-06 ENCOUNTER — Encounter: Payer: Self-pay | Admitting: Adult Health

## 2014-09-06 NOTE — Progress Notes (Signed)
Patient ID: Nancy [name], female   DOB: October 30, 1924, 80 y.o.   MRN: [id number]     [facility]     Code Status: DNR    Allergies   Allergen  Reactions   .  Macrobid [Nitrofurantoin]         unsure     Chief Complaint: Medical Management of Chronic Health Issues  HPI:   Nancy [name] is an 80 yr old female being seen today for a routine visit.  She is unable to participate in ROS and assessment due to dementia. She does has some lower leg edema that is increasing. She does not appear short of breath and denies any chest pain. Staff reports no new concerns.  Review of Systems:  Unable to assess       Past Medical History   Diagnosis  Date   .  Depression     .  Hypertension     .  GERD (gastroesophageal reflux disease)     .  Dysphagia     .  Rhabdomyolysis     .  UTI (lower urinary tract infection)     .  Chronic ulcer     .  Altered mental status     .  Subdural hematoma     .  Myocardial infarction     .  Ulcer     .  GI bleed        Past Surgical History   Procedure  Laterality  Date   .  Cholecystectomy       .  Hernia repair        Social History:   reports that she has never smoked. She has never used smokeless tobacco. She reports that she does not drink alcohol or use illicit drugs.  History reviewed. No pertinent family history.  Medications:    Medication List           This list is accurate as of: 09/05/14 11:28 AM.  Always use your most recent med list.                       acetaminophen 325 MG tablet   Commonly known as:  TYLENOL   Take 650 mg by mouth every 6 (six) hours as needed for pain (pain).        aspirin 81 MG tablet   Take 81 mg by mouth daily.        beta carotene w/minerals tablet   Take 1 tablet by mouth daily.        cholecalciferol 1000 UNITS tablet   Commonly known as:  VITAMIN D   Take 2 tablets (2,000 Units total) by mouth daily.        NAMENDA XR 28 MG Cp24   Generic drug:  Memantine HCl ER   Take 28 mg by mouth  daily.        ranitidine 150 MG tablet   Commonly known as:  ZANTAC   Take 1 tablet (150 mg total) by mouth at bedtime.        sertraline 50 MG tablet   Commonly known as:  ZOLOFT   Take 50 mg by mouth daily.             Physical Exam:    Filed Vitals:     09/05/14 1116   BP:  158/77   Pulse:  87   Temp:  98 F (36.7 C)   Resp:  18  Weight:  100 lb 9.6 oz (45.632 kg)   SpO2:  95%     General- elderly female in no acute distress; frail Neck- no lymphadenopathy, no thyromegaly, no jugular vein distension, no carotid bruit Cardiovascular- normal s1,s2, no murmurs/ rubs/ gallops; bilateral lower leg edema, 2+ pitting with poor circulation Respiratory- bilateral clear to auscultation, no wheeze, no rhonchi, no crackles, no use of accessory muscles Abdomen- bowel sounds present, soft, non tender, no organomegaly, no abdominal bruits, no guarding or rigidity Musculoskeletal- able to move all 4 extremities,  Wheelchair bound Neurological- decreased focal deficit, decreased mm strength Skin- warm and dry/thin; purple discoloration to lower extremities, blanches with palpation, 1+ dorsal pedal pulses Psychiatry- alert and pleasantly confused    SIGNIFICANT DIAGNOSTIC EXAMS   04-08-13: chest x-ray: borderline cardiomegaly no pulmonary vascur congestion or effusion. Old granulomatous disease. Mild bronchitis and chronic interstitial findings. And mid lung and perihilar regions.     LABS REVIEWED:   09-14-13: glucose 82; bun 19; creat 0.9; k+4.3; na++ 140;   10-19-13:vit d 24.9 11-28-13: wbc 5.9; hgb 13.0; hct 41.7; mcv 87.6; plt 243   03-07-14: wbc 5.7; hgb 12.3; hct 39.8 ;mcv 87.5; plt 236; glucose 77; bun 30; creat 0.6; k+4.4; na++140 vit d 16.42   03-10-14: wbc 4.9; hgb 13.0; hct 42.1; mcv 87.5; plt 237 urine culture; klebsiella pneumoniae: levaquin   04-18-14: vit d 37.41   05-29-14: wbc 5.9; hgb 11.5; hct 37.2; mcv 90.7; plt 218   Assessment/Plan   1. Hypertension:   stable; no medications at this time will monitor her status and  will continue asa 81 mg daily     2. GERD: stable; will continue zantac 150 mg daily and will monitor    3. Depression: is emotionally stable; will continue zoloft 25 mg daily   4. Vit D deficiency: no issues; will continue Vit D 2000 units daily   5. Dementia: no concerns; appears stable: will  continue namenda xr 28 mg daily and will monitor her status.   6. Leg edema: she most likely has  PVD/PAD; will start lasix  daily for 5 days; son does not want TED hose; will follow up  Leeanne Mannan, Student-NP  Synthia Innocent NP [facility]  Contact [phone] Monday through Friday 8am- 5pm  After hours call [phone]

## 2014-09-06 NOTE — Progress Notes (Signed)
This encounter was created in error - please disregard.

## 2014-10-09 ENCOUNTER — Encounter: Payer: Self-pay | Admitting: Adult Health

## 2014-10-09 ENCOUNTER — Non-Acute Institutional Stay (SKILLED_NURSING_FACILITY): Payer: Medicare Other | Admitting: Adult Health

## 2014-10-09 DIAGNOSIS — F0150 Vascular dementia without behavioral disturbance: Secondary | ICD-10-CM

## 2014-10-09 DIAGNOSIS — F329 Major depressive disorder, single episode, unspecified: Secondary | ICD-10-CM

## 2014-10-09 DIAGNOSIS — R609 Edema, unspecified: Secondary | ICD-10-CM

## 2014-10-09 DIAGNOSIS — K219 Gastro-esophageal reflux disease without esophagitis: Secondary | ICD-10-CM

## 2014-10-09 DIAGNOSIS — I10 Essential (primary) hypertension: Secondary | ICD-10-CM

## 2014-10-09 DIAGNOSIS — F32A Depression, unspecified: Secondary | ICD-10-CM

## 2014-10-09 NOTE — Progress Notes (Signed)
[facility]    Code Status: DNR  Allergies  Allergen Reactions  . Macrobid [Nitrofurantoin]     unsure    Chief Complaint: Medical Management of Chronic Health Issues  HPI:  Ms. Nancy [name] is a 80 yr old female being seen today for a routine visit. Overall she is doing well. No concerns from staff at this time. Patient cannot fully participate in ROS/HPI. She does deny any complaints. She does not appear short of breath or in any pain/discomfort. Her leg swelling has greatly improved since last visit.    Review of Systems:  Unable to assess   Past Medical History  Diagnosis Date  . Depression   . Hypertension   . GERD (gastroesophageal reflux disease)   . Dysphagia   . Rhabdomyolysis   . UTI (lower urinary tract infection)   . Chronic ulcer   . Altered mental status   . Subdural hematoma   . Myocardial infarction   . Ulcer   . GI bleed   . Vascular dementia without behavioral disturbance 01/05/2014   Past Surgical History  Procedure Laterality Date  . Cholecystectomy    . Hernia repair     Social History:   reports that she has never smoked. She has never used smokeless tobacco. She reports that she does not drink alcohol or use illicit drugs.  History reviewed. No pertinent family history.  Medications:   Medication List       This list is accurate as of: 10/09/14  2:21 PM.  Always use your most recent med list.               acetaminophen 325 MG tablet  Commonly known as:  TYLENOL  Take 650 mg by mouth every 6 (six) hours as needed for pain (pain).     aspirin 81 MG tablet  Take 81 mg by mouth daily.     beta carotene w/minerals tablet  Take 1 tablet by mouth daily.     cholecalciferol 1000 UNITS tablet  Commonly known as:  VITAMIN D  Take 2 tablets (2,000 Units total) by mouth daily.     NAMENDA XR 28 MG Cp24  Generic drug:  Memantine HCl ER  Take 28 mg by mouth daily.     ranitidine 150 MG tablet  Commonly known as:  ZANTAC  Take 1  tablet (150 mg total) by mouth at bedtime.     sertraline 50 MG tablet  Commonly known as:  ZOLOFT  Take 50 mg by mouth daily.          Physical Exam: Filed Vitals:   10/09/14 1236  BP: 100/57  Pulse: 76  Temp: 97.3 F (36.3 C)  Resp: 20  Weight: 99 lb 3.2 oz (44.997 kg)  SpO2: 95%    General- elderly female in no acute distress;frail Neck- no lymphadenopathy, no thyromegaly, no jugular vein distension, no carotid bruit Cardiovascular- normal s1,s2, no murmurs/ rubs/ gallops; mild leg edema noted; non-pitting Respiratory- bilateral clear to auscultation, no wheeze, no rhonchi, no crackles, no use of accessory muscles Abdomen- bowel sounds present, soft, non tender, no organomegaly, no abdominal bruits, no guarding or rigidity Musculoskeletal- able to move all 4 extremities, wheelchair bound Neurological- decreased mm strength Skin- warm and dry; bilateral purple discoloration to lower extremities; does decrease with elevation; TED hose on Psychiatry- alert    SIGNIFICANT DIAGNOSTIC EXAMS   04-08-13: chest x-ray: borderline cardiomegaly no pulmonary vascur congestion or effusion. Old granulomatous disease. Mild bronchitis  and chronic interstitial findings. And mid lung and perihilar regions.     LABS REVIEWED:     10-19-13:vit d 24.9 11-28-13: wbc 5.9; hgb 13.0; hct 41.7; mcv 87.6; plt 243   03-07-14: wbc 5.7; hgb 12.3; hct 39.8 ;mcv 87.5; plt 236; glucose 77; bun 30; creat 0.6; k+4.4; na++140 vit d 16.42   03-10-14: wbc 4.9; hgb 13.0; hct 42.1; mcv 87.5; plt 237 urine culture; klebsiella pneumoniae: levaquin   04-18-14: vit d 37.41   05-29-14: wbc 5.9; hgb 11.5; hct 37.2; mcv 90.7; plt 218     Assessment/Plan  1. Hypertension:  stable; no medications at this time will monitor her status and  will continue asa 81 mg daily     2. GERD: stable; will continue zantac 150 mg daily and will monitor    3. Depression: is emotionally stable; will continue zoloft 25 mg daily     4. Vit D deficiency: no issues; will continue Vit D 2000 units daily   5. Dementia: no concerns; appears stable: will  continue namenda xr 28 mg daily and will monitor her status.   6. Leg edema: greatly improved; is presently not on medications; she is not wearing ted hose at this time; her famiy did not want her to wear them. Will monitor her status.   Leeanne MannanKeatts, Nancy [name], Student-NP  Synthia Innocenteborah Zyan Coby NP [facility]  Contact [phone] Monday through Friday 8am- 5pm  After hours call [phone]

## 2014-11-08 ENCOUNTER — Encounter: Payer: Self-pay | Admitting: Registered Nurse

## 2014-11-08 ENCOUNTER — Non-Acute Institutional Stay (SKILLED_NURSING_FACILITY): Payer: Medicare Other | Admitting: Registered Nurse

## 2014-11-08 DIAGNOSIS — R234 Changes in skin texture: Secondary | ICD-10-CM

## 2014-11-08 DIAGNOSIS — I10 Essential (primary) hypertension: Secondary | ICD-10-CM

## 2014-11-08 DIAGNOSIS — K219 Gastro-esophageal reflux disease without esophagitis: Secondary | ICD-10-CM

## 2014-11-08 DIAGNOSIS — R600 Localized edema: Secondary | ICD-10-CM

## 2014-11-08 DIAGNOSIS — E559 Vitamin D deficiency, unspecified: Secondary | ICD-10-CM

## 2014-11-08 DIAGNOSIS — F0150 Vascular dementia without behavioral disturbance: Secondary | ICD-10-CM

## 2014-11-08 DIAGNOSIS — F32A Depression, unspecified: Secondary | ICD-10-CM

## 2014-11-08 DIAGNOSIS — B351 Tinea unguium: Secondary | ICD-10-CM

## 2014-11-08 DIAGNOSIS — F329 Major depressive disorder, single episode, unspecified: Secondary | ICD-10-CM

## 2014-11-08 NOTE — Progress Notes (Signed)
Patient ID: Nancy PatrickLeigh G Eastburn, female   DOB: 01/23/1924, 80 y.o.   MRN: [id number]   [facility]  Allergies  Allergen Reactions  . Macrobid [Nitrofurantoin]     unsure    Code Status: DNR  Goals of Care: Comfort and Quality of Life/Long term care  Chief Complaint  Patient presents with  . Medical Management of Chronic Issues    HTN, GERD, depression, vitD def, dementia, leg edema    HPI 80 y.o. female with PMH of vascular dementia, HTN, GERD, depression among others is being seen for a routine visit. No recent falls or new skin concerns reported. Weight stable. No change in behavior or functional status reported. No concerns from staff.   Review of Systems Unable to obtain   Past Medical History  Diagnosis Date  . Depression   . Hypertension   . GERD (gastroesophageal reflux disease)   . Dysphagia   . Rhabdomyolysis   . UTI (lower urinary tract infection)   . Chronic ulcer   . Altered mental status   . Subdural hematoma   . Myocardial infarction   . Ulcer   . GI bleed   . Vascular dementia without behavioral disturbance 01/05/2014    Past Surgical History  Procedure Laterality Date  . Cholecystectomy    . Hernia repair      History   Social History  . Marital Status: Widowed    Spouse Name: N/A    Number of Children: N/A  . Years of Education: N/A   Occupational History  . Not on file.   Social History Main Topics  . Smoking status: Never Smoker   . Smokeless tobacco: Never Used  . Alcohol Use: No  . Drug Use: No  . Sexual Activity: No   Other Topics Concern  . Not on file   Social History Narrative      Medication List       This list is accurate as of: 11/08/14  5:02 PM.  Always use your most recent med list.               acetaminophen 325 MG tablet  Commonly known as:  TYLENOL  Take 650 mg by mouth every 6 (six) hours as needed for pain (pain).     aspirin 81 MG tablet  Take 81 mg by mouth daily.     beta carotene w/minerals tablet  Take 1 tablet by mouth daily.     cholecalciferol 1000 UNITS tablet  Commonly known as:  VITAMIN D  Take 2 tablets (2,000 Units total) by mouth daily.     NAMENDA XR 28 MG Cp24  Generic drug:  Memantine HCl ER  Take 28 mg by mouth daily.     ranitidine 150 MG tablet  Commonly known as:  ZANTAC  Take 1 tablet (150 mg total) by mouth at bedtime.     sertraline 50 MG tablet  Commonly known as:  ZOLOFT  Take 50 mg by mouth daily.        Physical Exam Filed Vitals:   11/08/14 1657  BP: 136/89  Pulse: 71  Temp: 98.8 F (37.1 C)  Resp: 20   Constitutional: WDWN elderly female in no acute distress.  HEENT: Normocephalic and atraumatic. PERRL.  Oral mucosa moist. Posterior pharynx clear of any exudate or lesions. Upper denture in place Neck: Supple and nontender. No lymphadenopathy, masses, or thyromegaly. No JVD or carotid bruits. Cardiac: Normal S1, S2. RRR without appreciable  murmurs, rubs, or gallops. Distal pulses intact. 2+ dependent edema Lungs: No respiratory distress. Breath sounds clear bilaterally without rales, rhonchi, or wheezes. Abdomen: Audible bowel sounds in all quadrants. Soft, nontender, nondistended.   Musculoskeletal: Able to move all extremities.  Skin: Warm and dry. BLE erythamatous. Mycotic toenails noted. ~2x2cm black eschar noted on left fifth toe. No signs of infection noted.  Neurological: Alert.  Psychiatric: Appropriate mood and affect.   Labs Reviewed 10-19-13:vit d 24.9 11-28-13: wbc 5.9; hgb 13.0; hct 41.7; mcv 87.6; plt 243  03-07-14: wbc 5.7; hgb 12.3; hct 39.8 ;mcv 87.5; plt 236; glucose 77; bun 30; creat 0.6; k+4.4; na++140 vit d 16.42  03-10-14: wbc 4.9; hgb 13.0; hct 42.1; mcv 87.5; plt 237 urine culture; klebsiella pneumoniae: levaquin  04-18-14: vit d 37.41  05-29-14: wbc 5.9; hgb 11.5; hct 37.2; mcv 90.7; plt 218   Assessment & Plan 1. Essential hypertension, benign Stable. Not on any  antihypertensives. Continue to monitor.   2. Gastroesophageal reflux disease without esophagitis Stable. Continue zantac 150mg  daily and monitor.   3. Vascular dementia without behavioral disturbance No issues. Continue namenda xr 28mg  daily and asa 81mg  daily. Continue to monitor for change in behaviors. Continue fall risk and skin care.   4. Depression Stable. Continue zoloft 50mg  daily and monitor for change in mood.   5. Vitamin D deficiency Continue vitamin D 2000 iu daily and monitor.   6. Bilateral leg edema Chronic. With erythema and crusting around lateral lower leg. Most likely secondary to chronic venous stasis. Will continue to monitor.   7. Onychomycosis of toenail All ten toenails are affected. Will add patient to podiatry's list.   9. Eschar of Left fifth toe Most likely secondary to PAD. Will have wound RN assess and treat as appropriate. Continue to monitor.   Family/Staff Communication Plan of care discuss with nursing staff. Nursing staff verbalize understanding and agree with plan of care. No additional questions or concerns reported.    Loura BackKim Isaac Lacson, MSN, AGNP-C [facility] [address] [place], [place] [address] [phone] [8am-5pm] After hours: [phone]

## 2014-11-26 LAB — CBC AND DIFFERENTIAL
HCT: 42 % (ref 36–46)
Hemoglobin: 13.1 g/dL (ref 12.0–16.0)
Platelets: 241 10*3/uL (ref 150–399)
WBC: 5.4 10*3/mL

## 2014-11-28 ENCOUNTER — Non-Acute Institutional Stay (SKILLED_NURSING_FACILITY): Payer: Medicare Other | Admitting: Registered Nurse

## 2014-11-28 ENCOUNTER — Encounter: Payer: Self-pay | Admitting: Registered Nurse

## 2014-11-28 DIAGNOSIS — F329 Major depressive disorder, single episode, unspecified: Secondary | ICD-10-CM

## 2014-11-28 DIAGNOSIS — F0150 Vascular dementia without behavioral disturbance: Secondary | ICD-10-CM

## 2014-11-28 DIAGNOSIS — K219 Gastro-esophageal reflux disease without esophagitis: Secondary | ICD-10-CM

## 2014-11-28 DIAGNOSIS — E559 Vitamin D deficiency, unspecified: Secondary | ICD-10-CM

## 2014-11-28 DIAGNOSIS — R234 Changes in skin texture: Secondary | ICD-10-CM

## 2014-11-28 DIAGNOSIS — F32A Depression, unspecified: Secondary | ICD-10-CM

## 2014-11-28 DIAGNOSIS — R600 Localized edema: Secondary | ICD-10-CM

## 2014-11-28 DIAGNOSIS — I10 Essential (primary) hypertension: Secondary | ICD-10-CM

## 2014-11-28 NOTE — Progress Notes (Signed)
Patient ID: Nancy [name], female   DOB: 05/21/1924, 80 y.o.   MRN: [id number]   Place of Service: [facility]  Allergies  Allergen Reactions  . Macrobid [Nitrofurantoin]     unsure    Code Status: DNR  Goals of Care: Comfort and Quality of Life/Long term care  Chief Complaint  Patient presents with  . Medical Management of Chronic Issues    HTN, dementia, depression, GERD, leg edema, vit D def, aterial ulcer    HPI 80 y.o. female with PMH of vascular dementia, HTN, GERD, depression among others is being seen for a routine visit for management of her chronic issues. No recent falls or new skin concerns reported. Arterial ulcer with black eschar of left fifth toe resolved. Has 3 lb weight loss since last routine visit but weight stable overall. No change in behavior or functional status reported. No concerns from staff. Depression is stable on zoloft. Vit D deficiency is stable on vitamin D supplement. GERD is stable on zantac. No complaints verbalized from patient  Review of Systems Unable to obtain due to dementia  Past Medical History  Diagnosis Date  . Depression   . Hypertension   . GERD (gastroesophageal reflux disease)   . Dysphagia   . Rhabdomyolysis   . UTI (lower urinary tract infection)   . Chronic ulcer   . Altered mental status   . Subdural hematoma   . Myocardial infarction   . Ulcer   . GI bleed   . Vascular dementia without behavioral disturbance 01/05/2014    Past Surgical History  Procedure Laterality Date  . Cholecystectomy    . Hernia repair      History   Social History  . Marital Status: Widowed    Spouse Name: N/A    Number of Children: N/A  . Years of Education: N/A   Occupational History  . Not on file.   Social History Main Topics  . Smoking status: Never Smoker   . Smokeless tobacco: Never Used  . Alcohol Use: No  . Drug Use: No  . Sexual Activity: No   Other Topics Concern  . Not on file   Social History Narrative       Medication List       This list is accurate as of: 11/28/14 11:01 PM.  Always use your most recent med list.               acetaminophen 325 MG tablet  Commonly known as:  TYLENOL  Take 650 mg by mouth every 6 (six) hours as needed for pain (pain).     aspirin 81 MG tablet  Take 81 mg by mouth daily.     beta carotene w/minerals tablet  Take 1 tablet by mouth daily.     cholecalciferol 1000 UNITS tablet  Commonly known as:  VITAMIN D  Take 2 tablets (2,000 Units total) by mouth daily.     NAMENDA XR 28 MG Cp24  Generic drug:  Memantine HCl ER  Take 28 mg by mouth daily.     ranitidine 150 MG tablet  Commonly known as:  ZANTAC  Take 1 tablet (150 mg total) by mouth at bedtime.     sertraline 50 MG tablet  Commonly known as:  ZOLOFT  Take 50 mg by mouth daily.        Physical Exam  BP 142/81 mmHg  Pulse 84  Temp(Src) 97.6 F (36.4 C)  Resp 20  Ht 5' (  1.524 m)  Wt 101 lb 3.2 oz (45.904 kg)  BMI 19.76 kg/m2   Constitutional: thin elderly female in no acute distress. Pleasantly confused HEENT: Normocephalic and atraumatic. PERRL.  Oral mucosa moist. Posterior pharynx clear of any exudate or lesions. Upper denture in place Neck: Supple and nontender. No lymphadenopathy, masses, or thyromegaly. No JVD or carotid bruits. Cardiac: Normal S1, S2. RRR without appreciable murmurs, rubs, or gallops. Distal pulses intact. 2+ dependent edema Lungs: No respiratory distress. Breath sounds clear bilaterally without rales, rhonchi, or wheezes. Abdomen: Audible bowel sounds in all quadrants. Soft, nontender, nondistended.   Musculoskeletal: Able to move all extremities.  Skin: Warm and dry. BLE erythamatous. Mycotic toenails.  Neurological: Alert.  Psychiatric: Appropriate mood and affect. Has dementia at baseline  Labs Reviewed CBC Latest Ref Rng 11/26/2014 05/29/2014 11/30/2009  WBC - 5.4 5.9 5.2  Hemoglobin 12.0 - 16.0 g/dL 16.113.1 11.5(A) 10.9 DELTA CHECK NOTED(L)    Hematocrit 36 - 46 % 42 37 32.5(L)  Platelets 150 - 399 K/L 241 218 287    CMP Latest Ref Rng 03/07/2014 11/30/2009 11/29/2009  Glucose 70 - 99 mg/dL - 77 096(E115(H)  BUN 4 - 21 mg/dL 45(W30(A) 6 14  Creatinine 0.5 - 1.1 mg/dL 0.6 0.980.48 1.190.60 DELTA CHECK NOTED  Sodium 137 - 147 mmol/L 140 138 DELTA CHECK NOTED 147(H)  Potassium 3.4 - 5.3 mmol/L 4.4 3.6 3.3(L)  Chloride 96 - 112 mEq/L - 111 120(H)  CO2 19 - 32 mEq/L - 24 24  Calcium 8.4 - 10.5 mg/dL - 7.7(L) 7.8(L)  Total Protein 6.0 - 8.3 g/dL - - -  Total Bilirubin 0.3 - 1.2 mg/dL - - -  Alkaline Phos 39 - 117 U/L - - -  AST 0 - 37 U/L - - -  ALT 0 - 35 U/L - - -    Assessment & Plan 1. Essential hypertension, benign Overall stable. Has couple elevated BP reading. Current not on meds. Continue to monitor for now. If BP consistently >150/90, will put her on low dose antihypertensive.   2. Gastroesophageal reflux disease without esophagitis Stable. Continue zantac 150mg  daily and monitor.   3. Vascular dementia without behavioral disturbance Stable. Continue namenda xr 28mg  daily and asa 81mg  daily. Continue to monitor for change in behaviors. Continue fall risk and pressure ulcer prophylaxis  4. Depression Stable. Continue zoloft 50mg  daily and monitor for change in mood.   5. Vitamin D deficiency Stable. Continue vitamin D 2000 iu daily and monitor.   6. Bilateral leg edema Persists. With erythema of bilateral lower leg. Most likely secondary to chronic venous stasis. Continue t  7. Eschar of Left fifth toe Resolved. Continue skin care to BLE and monitor.    Labs ordered: TSH, CMP  Family/Staff Communication Plan of care discuss with nursing staff. Nursing staff verbalize understanding and agree with plan of care. No additional questions or concerns reported.    Loura BackKim Rylynn Kobs, MSN, AGNP-C [facility] [address] [place], [place] [address] [phone] [8am-5pm] After hours: [phone]

## 2014-12-25 ENCOUNTER — Non-Acute Institutional Stay (SKILLED_NURSING_FACILITY): Payer: Medicare Other | Admitting: Internal Medicine

## 2014-12-25 DIAGNOSIS — F0393 Unspecified dementia, unspecified severity, with mood disturbance: Secondary | ICD-10-CM | POA: Insufficient documentation

## 2014-12-25 DIAGNOSIS — E559 Vitamin D deficiency, unspecified: Secondary | ICD-10-CM

## 2014-12-25 DIAGNOSIS — F329 Major depressive disorder, single episode, unspecified: Secondary | ICD-10-CM

## 2014-12-25 DIAGNOSIS — F0150 Vascular dementia without behavioral disturbance: Secondary | ICD-10-CM | POA: Insufficient documentation

## 2014-12-25 DIAGNOSIS — F0280 Dementia in other diseases classified elsewhere without behavioral disturbance: Secondary | ICD-10-CM

## 2014-12-25 DIAGNOSIS — K219 Gastro-esophageal reflux disease without esophagitis: Secondary | ICD-10-CM | POA: Insufficient documentation

## 2014-12-25 DIAGNOSIS — H04123 Dry eye syndrome of bilateral lacrimal glands: Secondary | ICD-10-CM | POA: Insufficient documentation

## 2014-12-25 LAB — BASIC METABOLIC PANEL
BUN: 28 mg/dL — AB (ref 4–21)
Creatinine: 0.8 mg/dL (ref 0.5–1.1)
Glucose: 104 mg/dL
Potassium: 4.3 mmol/L (ref 3.4–5.3)
Sodium: 139 mmol/L (ref 137–147)

## 2014-12-25 LAB — TSH: TSH: 4.49 u[IU]/mL (ref 0.41–5.90)

## 2014-12-25 LAB — LIPID PANEL
Cholesterol: 145 mg/dL (ref 0–200)
HDL: 51 mg/dL (ref 35–70)
LDL CALC: 75 mg/dL
TRIGLYCERIDES: 96 mg/dL (ref 40–160)

## 2014-12-25 NOTE — Progress Notes (Signed)
Patient ID: Nancy [name], female   DOB: 02/09/1924, 81 y.o.   MRN: [id number]    Facility: [facility]   Chief Complaint  Patient presents with  . Medical Management of Chronic Issues   Allergies  Allergen Reactions  . Macrobid [Nitrofurantoin]     unsure   HPI 81 y.o. female patient is seen for routine visit. She has dementia which limits her history taking and ROS. No new concern from staff. No falls reported. No new skin concerns.  She has PMH of vascular dementia, HTN, GERD, depression.  ROS Unable to obtain   Past Medical History  Diagnosis Date  . Depression   . Hypertension   . GERD (gastroesophageal reflux disease)   . Dysphagia   . Rhabdomyolysis   . UTI (lower urinary tract infection)   . Chronic ulcer   . Altered mental status   . Subdural hematoma   . Myocardial infarction   . Ulcer   . GI bleed   . Vascular dementia without behavioral disturbance 01/05/2014     Medication List       This list is accurate as of: 12/25/14  3:33 PM.  Always use your most recent med list.               acetaminophen 325 MG tablet  Commonly known as:  TYLENOL  Take 650 mg by mouth every 6 (six) hours as needed for pain (pain).     aspirin 81 MG tablet  Take 81 mg by mouth daily.     beta carotene w/minerals tablet  Take 1 tablet by mouth daily.     cholecalciferol 1000 UNITS tablet  Commonly known as:  VITAMIN D  Take 2 tablets (2,000 Units total) by mouth daily.     NAMENDA XR 28 MG Cp24 24 hr capsule  Generic drug:  memantine  Take 28 mg by mouth daily.     ranitidine 150 MG tablet  Commonly known as:  ZANTAC  Take 1 tablet (150 mg total) by mouth at bedtime.     sertraline 50 MG tablet  Commonly known as:  ZOLOFT  Take 25 mg by mouth daily.     SYSTANE 0.4-0.3 % Soln  Generic drug:  Polyethyl Glycol-Propyl Glycol  Apply 1 drop to eye daily as needed.       Physical exam BP 125/67 mmHg  Pulse 83  Temp(Src) 96.6 F (35.9  C)  Resp 18  General- elderly female in no acute distress Head- atraumatic, normocephalic Eyes- PERRLA, EOMI, no pallor, no icterus, no discharge Neck- no lymphadenopathy Mouth- normal mucus membrane Cardiovascular- normal s1,s2, no murmurs, normal distal pulses Respiratory- bilateral clear to auscultation, no wheeze, no rhonchi, no crackles Abdomen- bowel sounds present, soft, non tender Musculoskeletal- able to move all 4 extremities, on wheelchair, has dependent leg edema Neurological- no focal deficit Skin- warm and dry Psychiatry- alert and pleasantly confused  Labs 11/27/14 wbc 5.4, hb 13.1, hct 42.1, plt 241, vit d 33,    Assessment/plan  Vascular dementia bp controlled, no recent behavior changes. Continue bp meds and namenda xr 28 mg daily, continue skin care, pressure ulcer prophylaxis and fall precautions. To provide assistance with her ADLs. Continue baby aspirin. No recent lipid panel, thyroid panel or CMP available for review. Check this next lab.  Dry eyes Continue her ocuvite and systane eye drops  gerd Continue zantac 150 mg daily, symptoms controlled  Depression with dementia Tolerating zxoloft 25 mg reduced dosing  well, monitor clinically  Vitamin d def Vit d level reviewed, on lower side of normal, continue vit d 2000 iu daily  Labs- Cmp, lipid panel, tsh next lab

## 2015-01-22 ENCOUNTER — Non-Acute Institutional Stay (SKILLED_NURSING_FACILITY): Payer: Medicare Other | Admitting: Registered Nurse

## 2015-01-22 DIAGNOSIS — F0150 Vascular dementia without behavioral disturbance: Secondary | ICD-10-CM

## 2015-01-22 DIAGNOSIS — E559 Vitamin D deficiency, unspecified: Secondary | ICD-10-CM

## 2015-01-22 DIAGNOSIS — H04123 Dry eye syndrome of bilateral lacrimal glands: Secondary | ICD-10-CM

## 2015-01-22 DIAGNOSIS — Z0000 Encounter for general adult medical examination without abnormal findings: Secondary | ICD-10-CM

## 2015-01-22 DIAGNOSIS — F0280 Dementia in other diseases classified elsewhere without behavioral disturbance: Secondary | ICD-10-CM

## 2015-01-22 DIAGNOSIS — F329 Major depressive disorder, single episode, unspecified: Secondary | ICD-10-CM

## 2015-01-22 DIAGNOSIS — F0393 Unspecified dementia, unspecified severity, with mood disturbance: Secondary | ICD-10-CM

## 2015-01-22 DIAGNOSIS — K219 Gastro-esophageal reflux disease without esophagitis: Secondary | ICD-10-CM

## 2015-01-22 DIAGNOSIS — R600 Localized edema: Secondary | ICD-10-CM

## 2015-01-23 ENCOUNTER — Encounter: Payer: Self-pay | Admitting: Registered Nurse

## 2015-01-23 NOTE — Progress Notes (Signed)
Patient ID: Nancy [name], female   DOB: 03/17/1924, 81 y.o.   MRN: [id number]   Place of Service: [facility]  Allergies  Allergen Reactions  . Macrobid [Nitrofurantoin]     unsure    Code Status: DNR  Goals of Care: Comfort and Quality of Life/Long term care  Chief Complaint  Patient presents with  . Annual Exam  . Medical Management of Chronic Issues    dementia, GERD, HTN, depression, bil edema, vit D deficiency     HPI 81 y.o. female with PMH of vascular dementia, HTN, GERD, depression among others is being seen for an annual health visit and a routine visit for management of her chronic issues. Weight stable. No recent falls or skin concerns reported.  No change in behavior or functional status reported. No concerns from staff. Depression is stable on zoloft. Vit D deficiency is stable on vitamin D supplement. GERD is stable on zantac. She is up-to-date with her influenza (09/19/14) and pneumonia vaccine (09/22/11). Seen in room today, unable to participate in HPI and ROS but denies any complaints  Review of Systems Unable to obtain due to dementia  Past Medical History  Diagnosis Date  . Depression   . Hypertension   . GERD (gastroesophageal reflux disease)   . Dysphagia   . Rhabdomyolysis   . UTI (lower urinary tract infection)   . Chronic ulcer   . Altered mental status   . Subdural hematoma   . Myocardial infarction   . Ulcer   . GI bleed   . Vascular dementia without behavioral disturbance 01/05/2014    Past Surgical History  Procedure Laterality Date  . Cholecystectomy    . Hernia repair      History   Social History  . Marital Status: Widowed    Spouse Name: N/A    Number of Children: N/A  . Years of Education: N/A   Occupational History  . Not on file.   Social History Main Topics  . Smoking status: Never Smoker   . Smokeless tobacco: Never Used  . Alcohol Use: No  . Drug Use: No  . Sexual Activity: No   Other Topics Concern  .  Not on file   Social History Narrative      Medication List       This list is accurate as of: 01/22/15 11:59 PM.  Always use your most recent med list.               acetaminophen 325 MG tablet  Commonly known as:  TYLENOL  Take 650 mg by mouth every 6 (six) hours as needed for pain (pain).     aspirin 81 MG tablet  Take 81 mg by mouth daily.     beta carotene w/minerals tablet  Take 1 tablet by mouth daily.     cholecalciferol 1000 UNITS tablet  Commonly known as:  VITAMIN D  Take 2 tablets (2,000 Units total) by mouth daily.     NAMENDA XR 28 MG Cp24 24 hr capsule  Generic drug:  memantine  Take 28 mg by mouth daily.     ranitidine 150 MG tablet  Commonly known as:  ZANTAC  Take 1 tablet (150 mg total) by mouth at bedtime.     sertraline 50 MG tablet  Commonly known as:  ZOLOFT  Take 25 mg by mouth daily.     SYSTANE 0.4-0.3 % Soln  Generic drug:  Polyethyl Glycol-Propyl Glycol  Apply 1 drop  to eye daily as needed.        Physical Exam  BP 112/70 mmHg  Pulse 78  Temp(Src) 97.6 F (36.4 C)  Resp 20  Ht 5' (1.524 m)  Wt 101 lb 12.8 oz (46.176 kg)  BMI 19.88 kg/m2  SpO2 96%   Constitutional: thin elderly female in no acute distress. Pleasantly confused. Able to follow simple commands.  HEENT: Normocephalic and atraumatic. PERRL.  Oral mucosa moist. Posterior pharynx clear of any exudate or lesions. Upper denture in place Neck: Supple and nontender. No lymphadenopathy, masses, or thyromegaly. No JVD or carotid bruits. Cardiac: Normal S1, S2. RRR without appreciable murmurs, rubs, or gallops. Distal pulses intact. 2+ pitting dependent edema Lungs: No respiratory distress. Breath sounds clear bilaterally without rales, rhonchi, or wheezes. Abdomen: Audible bowel sounds in all quadrants. Soft, nontender, nondistended.   Musculoskeletal: Able to move all extremities.  Skin: Warm and dry. BLE erythamatous. Mycotic toenails.  Neurological: Alert.    Psychiatric: Appropriate mood and affect. Has dementia at baseline  Labs Reviewed CBC Latest Ref Rng 11/26/2014 05/29/2014 11/30/2009  WBC - 5.4 5.9 5.2  Hemoglobin 12.0 - 16.0 g/dL 16.1 11.5(A) 10.9 DELTA CHECK NOTED(L)  Hematocrit 36 - 46 % 42 37 32.5(L)  Platelets 150 - 399 K/L 241 218 287    CMP Latest Ref Rng 12/25/2014 03/07/2014 11/30/2009  Glucose 70 - 99 mg/dL - - 77  BUN 4 - 21 mg/dL 09(U) 04(V) 6  Creatinine 0.5 - 1.1 mg/dL 0.8 0.6 4.09  Sodium 811 - 147 mmol/L 139 140 138 DELTA CHECK NOTED  Potassium 3.4 - 5.3 mmol/L 4.3 4.4 3.6  Chloride 96 - 112 mEq/L - - 111  CO2 19 - 32 mEq/L - - 24  Calcium 8.4 - 10.5 mg/dL - - 7.7(L)  Total Protein 6.0 - 8.3 g/dL - - -  Total Bilirubin 0.3 - 1.2 mg/dL - - -  Alkaline Phos 39 - 117 U/L - - -  AST 0 - 37 U/L - - -  ALT 0 - 35 U/L - - -    Assessment & Plan 1. Annual physical exam Up to date with influenza and pneumonia vaccine. Able to propel self in wheelchair. Will order DEXA scan today to screen for osteoporosis. Is not a candidate for colonoscopy, will order FOBT x3 now and annually in Jan for colon ca screening.   2. Gastroesophageal reflux disease without esophagitis Stable. Continue zantac  daily and monitor.   3. Vascular dementia without behavioral disturbance Stable. Further declined anticipated. Continue namenda xr  daily and asa  daily. Continue to monitor for change in behaviors. Continue fall risk and pressure ulcer prophylaxis  4. Depression Stable. Continue zoloft  daily and monitor for change in mood.   5. Vitamin D deficiency Stable. Continue vitamin D 2000 iu daily and monitor.   6. Bilateral leg edema Chronic. With erythema of bilateral lower leg. Family refuses TED hose. Will continue to monitor for now. Encourage elevating BLE while in bed.   7. Dry eye, bilateral No issues. Continue systane to each eye daily as needed and monitor.   Health Promotion  Continue to offer resident  recommended immunization and screenings as appropriate for age and health status. Encourage participation in physical activities as tolerated.   Family/Staff Communication Plan of care discuss with nursing staff. Nursing staff verbalize understanding and agree with plan of care. No additional questions or concerns reported.    Loura Back, MSN, AGNP-C [facility] [address] [place], [place] [address] [phone] [8am-5pm] After hours: [phone]

## 2015-02-20 ENCOUNTER — Non-Acute Institutional Stay (SKILLED_NURSING_FACILITY): Payer: Medicare Other | Admitting: Registered Nurse

## 2015-02-20 DIAGNOSIS — E559 Vitamin D deficiency, unspecified: Secondary | ICD-10-CM | POA: Diagnosis not present

## 2015-02-20 DIAGNOSIS — F329 Major depressive disorder, single episode, unspecified: Secondary | ICD-10-CM

## 2015-02-20 DIAGNOSIS — K219 Gastro-esophageal reflux disease without esophagitis: Secondary | ICD-10-CM | POA: Diagnosis not present

## 2015-02-20 DIAGNOSIS — F0150 Vascular dementia without behavioral disturbance: Secondary | ICD-10-CM

## 2015-02-20 DIAGNOSIS — E46 Unspecified protein-calorie malnutrition: Secondary | ICD-10-CM

## 2015-02-20 DIAGNOSIS — R600 Localized edema: Secondary | ICD-10-CM

## 2015-02-20 DIAGNOSIS — F0393 Unspecified dementia, unspecified severity, with mood disturbance: Secondary | ICD-10-CM

## 2015-02-20 DIAGNOSIS — F0280 Dementia in other diseases classified elsewhere without behavioral disturbance: Secondary | ICD-10-CM | POA: Diagnosis not present

## 2015-02-21 ENCOUNTER — Encounter: Payer: Self-pay | Admitting: Registered Nurse

## 2015-02-21 NOTE — Progress Notes (Signed)
Patient ID: Nancy [name], female   DOB: 04/07/1924, 81 y.o.   MRN: [id number]   Place of Service: [facility]  Allergies  Allergen Reactions  . Macrobid [Nitrofurantoin]     unsure    Code Status: DNR  Goals of Care: Comfort and Quality of Life/Long term care  Chief Complaint  Patient presents with  . Medical Management of Chronic Issues    vascular dementia, depression, GERD, vit D def. bil leg edema    HPI 81 y.o. female with PMH of vascular dementia, HTN, GERD, depression among others is being seen for a routine visit for management of her chronic issues. Has gradual declines in weight-lost 4lbs w/in the past 30 days. No recent falls or skin concerns reported.  No change in behavior or functional status reported. No concerns from staff. Vit D deficiency is stable on vitamin D supplement. GERD is stable on H2 blocker. Mood is stable on zoloft. Dementia is advanced. Seen in room today, unable to participate in HPI and ROS but denies any pain or discomfort.   Review of Systems Unable to obtain due to dementia  Past Medical History  Diagnosis Date  . Depression   . Hypertension   . GERD (gastroesophageal reflux disease)   . Dysphagia   . Rhabdomyolysis   . UTI (lower urinary tract infection)   . Chronic ulcer   . Altered mental status   . Subdural hematoma   . Myocardial infarction   . Ulcer   . GI bleed   . Vascular dementia without behavioral disturbance 01/05/2014    Past Surgical History  Procedure Laterality Date  . Cholecystectomy    . Hernia repair      History   Social History  . Marital Status: Widowed    Spouse Name: N/A  . Number of Children: N/A  . Years of Education: N/A   Occupational History  . Not on file.   Social History Main Topics  . Smoking status: Never Smoker   . Smokeless tobacco: Never Used  . Alcohol Use: No  . Drug Use: No  . Sexual Activity: No   Other Topics Concern  . Not on file   Social History Narrative       Medication List       This list is accurate as of: 02/20/15 11:59 PM.  Always use your most recent med list.               acetaminophen 325 MG tablet  Commonly known as:  TYLENOL  Take 650 mg by mouth every 4 (four) hours as needed (pain).     aspirin 81 MG tablet  Take 81 mg by mouth daily.     beta carotene w/minerals tablet  Take 1 tablet by mouth daily.     cholecalciferol 1000 UNITS tablet  Commonly known as:  VITAMIN D  Take 2 tablets (2,000 Units total) by mouth daily.     NAMENDA XR 28 MG Cp24 24 hr capsule  Generic drug:  memantine  Take 28 mg by mouth daily.     ranitidine 150 MG tablet  Commonly known as:  ZANTAC  Take 1 tablet (150 mg total) by mouth at bedtime.     sertraline 50 MG tablet  Commonly known as:  ZOLOFT  Take 25 mg by mouth daily.        Physical Exam  BP 109/60 mmHg  Pulse 62  Temp(Src) 97.3 F (36.3 C)  Resp 19  Ht 5' (1.524 m)  Wt 97 lb 9.6 oz (44.271 kg)  BMI 19.06 kg/m2  SpO2 93%   Constitutional: frail elderly female in no acute distress. Pleasantly confused. Able to follow simple commands.  HEENT: Normocephalic and atraumatic. PERRL.  Oral mucosa moist.   Neck: Supple and nontender. No lymphadenopathy, masses, or thyromegaly. No JVD or carotid bruits. Cardiac: Normal S1, S2. RRR without appreciable murmurs, rubs, or gallops. Distal pulses intact. 2+ pitting dependent edema Lungs: No respiratory distress. Breath sounds clear bilaterally without rales, rhonchi, or wheezes. Abdomen: Audible bowel sounds in all quadrants. Soft, nontender, nondistended.   Musculoskeletal: Able to move all extremities.  Skin: Warm and dry. BLE erythamatous, cool to touch. Mycotic toenails.  Neurological: Alert.  Psychiatric: Appropriate mood and affect. Has dementia at baseline  Labs Reviewed CBC Latest Ref Rng 11/26/2014 05/29/2014 11/30/2009  WBC - 5.4 5.9 5.2  Hemoglobin 12.0 - 16.0 g/dL 82.9 11.5(A) 10.9 DELTA CHECK NOTED(L)   Hematocrit 36 - 46 % 42 37 32.5(L)  Platelets 150 - 399 K/L 241 218 287    CMP Latest Ref Rng 12/25/2014 03/07/2014 11/30/2009  Glucose 70 - 99 mg/dL - - 77  BUN 4 - 21 mg/dL 56(O) 13(Y) 6  Creatinine 0.5 - 1.1 mg/dL 0.8 0.6 8.65  Sodium 784 - 147 mmol/L 139 140 138 DELTA CHECK NOTED  Potassium 3.4 - 5.3 mmol/L 4.3 4.4 3.6  Chloride 96 - 112 mEq/L - - 111  CO2 19 - 32 mEq/L - - 24  Calcium 8.4 - 10.5 mg/dL - - 7.7(L)  Total Protein 6.0 - 8.3 g/dL - - -  Total Bilirubin 0.3 - 1.2 mg/dL - - -  Alkaline Phos 39 - 117 U/L - - -  AST 0 - 37 U/L - - -  ALT 0 - 35 U/L - - -   Lipid Panel     Component Value Date/Time   CHOL 145 12/25/2014   TRIG 96 12/25/2014   HDL 51 12/25/2014   CHOLHDL 2.1 06/23/2009 0221   VLDL 14 06/23/2009 0221   LDLCALC 75 12/25/2014   Assessment & Plan 1. Gastroesophageal reflux disease without esophagitis No issues. Continue zantac  daily and monitor.   2. Vascular dementia without behavioral disturbance Stable. Further declined anticipated. Continue namenda xr  daily and asa  daily. Continue to monitor for change in behaviors. Continue fall risk and pressure ulcer prophylaxis. Continue assist with ADL care.  3. Depression Mood stable. Continue zoloft  daily and monitor for change in mood.   4. Vitamin D deficiency Stable. Continue vitamin D 2000 iu daily and monitor.   5. Bilateral leg edema Chronic. Mostly likely chronic venous insufficiency. No TED hose per family request. Will continue to monitor for now. Encourage elevating BLE while in bed.   6. Protein-calorie malnutrition Gradual weight loss. Further declined anticipated with advanced dementia. Continue pureed diet, thin liquids, with med pass 60mL four times daily for supplement. Continue to monitor her status.    Family/Staff Communication Plan of care discuss with nursing staff. Nursing staff verbalize understanding and agree with plan of care. No additional questions  or concerns reported.    Loura Back, MSN, AGNP-C [facility] [address] [place], [place] [address] [phone] [8am-5pm] After hours: [phone]

## 2015-03-21 ENCOUNTER — Encounter: Payer: Self-pay | Admitting: Registered Nurse

## 2015-03-21 ENCOUNTER — Non-Acute Institutional Stay (SKILLED_NURSING_FACILITY): Payer: Medicare Other | Admitting: Registered Nurse

## 2015-03-21 DIAGNOSIS — F329 Major depressive disorder, single episode, unspecified: Secondary | ICD-10-CM

## 2015-03-21 DIAGNOSIS — F0150 Vascular dementia without behavioral disturbance: Secondary | ICD-10-CM

## 2015-03-21 DIAGNOSIS — B372 Candidiasis of skin and nail: Secondary | ICD-10-CM

## 2015-03-21 DIAGNOSIS — R600 Localized edema: Secondary | ICD-10-CM

## 2015-03-21 DIAGNOSIS — K219 Gastro-esophageal reflux disease without esophagitis: Secondary | ICD-10-CM | POA: Diagnosis not present

## 2015-03-21 DIAGNOSIS — F0393 Unspecified dementia, unspecified severity, with mood disturbance: Secondary | ICD-10-CM

## 2015-03-21 DIAGNOSIS — E46 Unspecified protein-calorie malnutrition: Secondary | ICD-10-CM

## 2015-03-21 DIAGNOSIS — F0280 Dementia in other diseases classified elsewhere without behavioral disturbance: Secondary | ICD-10-CM | POA: Diagnosis not present

## 2015-03-21 DIAGNOSIS — E559 Vitamin D deficiency, unspecified: Secondary | ICD-10-CM | POA: Diagnosis not present

## 2015-03-21 NOTE — Progress Notes (Signed)
Patient ID: Nancy [name], female   DOB: [birth year], 81 y.o.   MRN: [id number]   Place of Service: [facility]  Allergies  Allergen Reactions  . Macrobid [Nitrofurantoin]     unsure    Code Status: DNR  Goals of Care: Comfort and Quality of Life/Long term care  Chief Complaint  Patient presents with  . Medical Management of Chronic Issues    dementia, gerd, depression, vit d deficiency, ble edema, protein cal malnutrition    HPI 81 y.o. female with PMH of vascular dementia, HTN, GERD, depression among others is being seen for a routine visit for management of her chronic issues. Weight stable over the past 30 days. No recent falls.  No change in behavior or functional status reported. Per nursing staff, patient has red rash under both breasts-no other concerns reported.  Dementia is advanced. Mood stable on zoloft. Vit D deficiency is stable on vitamin D supplement. GERD is stable on H2 blocker. Mood is stable on zoloft. Seen in wheelchair today, unable to participate in HPI and ROS but denies any pain or discomfort.   Review of Systems Unable to obtain due to dementia  Past Medical History  Diagnosis Date  . Depression   . Hypertension   . GERD (gastroesophageal reflux disease)   . Dysphagia   . Rhabdomyolysis   . UTI (lower urinary tract infection)   . Chronic ulcer   . Altered mental status   . Subdural hematoma   . Myocardial infarction   . Ulcer   . GI bleed   . Vascular dementia without behavioral disturbance 01/05/2014    Past Surgical History  Procedure Laterality Date  . Cholecystectomy    . Hernia repair      History   Social History  . Marital Status: Widowed    Spouse Name: N/A  . Number of Children: N/A  . Years of Education: N/A   Occupational History  . Not on file.   Social History Main Topics  . Smoking status: Never Smoker   . Smokeless tobacco: Never Used  . Alcohol Use: No  . Drug Use: No  . Sexual Activity: No   Other  Topics Concern  . Not on file   Social History Narrative      Medication List       This list is accurate as of: 03/21/15  4:42 PM.  Always use your most recent med list.               acetaminophen 325 MG tablet  Commonly known as:  TYLENOL  Take 650 mg by mouth every 4 (four) hours as needed (pain).     aspirin 81 MG tablet  Take 81 mg by mouth daily.     beta carotene w/minerals tablet  Take 1 tablet by mouth daily.     cholecalciferol 1000 UNITS tablet  Commonly known as:  VITAMIN D  Take 2 tablets (2,000 Units total) by mouth daily.     NAMENDA XR 28 MG Cp24 24 hr capsule  Generic drug:  memantine  Take 28 mg by mouth daily.     ranitidine 150 MG tablet  Commonly known as:  ZANTAC  Take 1 tablet (150 mg total) by mouth at bedtime.     sertraline 50 MG tablet  Commonly known as:  ZOLOFT  Take 25 mg by mouth daily.        Physical Exam  BP 94/63 mmHg  Pulse 63  Temp(Src)  97.3 F (36.3 C)  Resp 16  Ht 5' (1.524 m)  Wt 97 lb 9.6 oz (44.271 kg)  BMI 19.06 kg/m2  SpO2 95%   Constitutional: frail elderly female in no acute distress. Pleasantly confused. Able to follow simple commands.  HEENT: Normocephalic and atraumatic. PERRL. Oral mucosa moist.   Neck: Supple and nontender. No lymphadenopathy, masses, or thyromegaly. No JVD or carotid bruits. Cardiac: Normal S1, S2. RRR without appreciable murmurs, rubs, or gallops. Distal pulses intact. 2+ pitting dependent edema Lungs: No respiratory distress. Breath sounds clear bilaterally without rales, rhonchi, or wheezes. Abdomen: Audible bowel sounds in all quadrants. Soft, nontender, nondistended.   Musculoskeletal: Able to move all extremities.  Skin: Warm and dry. BLE erythematous, cool to touch. Erythematous rash noted under both breasts.   Neurological: Alert.  Psychiatric: flat affect  Labs Reviewed CBC Latest Ref Rng 11/26/2014 05/29/2014 11/30/2009  WBC - 5.4 5.9 5.2  Hemoglobin 12.0 - 16.0 g/dL  96.213.1 11.5(A) 10.9 DELTA CHECK NOTED(L)  Hematocrit 36 - 46 % 42 37 32.5(L)  Platelets 150 - 399 K/L 241 218 287    CMP Latest Ref Rng 12/25/2014 03/07/2014 11/30/2009  Glucose 70 - 99 mg/dL - - 77  BUN 4 - 21 mg/dL 95(M28(A) 84(X30(A) 6  Creatinine 0.5 - 1.1 mg/dL 0.8 0.6 3.240.48  Sodium 401137 - 147 mmol/L 139 140 138 DELTA CHECK NOTED  Potassium 3.4 - 5.3 mmol/L 4.3 4.4 3.6  Chloride 96 - 112 mEq/L - - 111  CO2 19 - 32 mEq/L - - 24  Calcium 8.4 - 10.5 mg/dL - - 7.7(L)  Total Protein 6.0 - 8.3 g/dL - - -  Total Bilirubin 0.3 - 1.2 mg/dL - - -  Alkaline Phos 39 - 117 U/L - - -  AST 0 - 37 U/L - - -  ALT 0 - 35 U/L - - -   Lipid Panel     Component Value Date/Time   CHOL 145 12/25/2014   TRIG 96 12/25/2014   HDL 51 12/25/2014   CHOLHDL 2.1 06/23/2009 0221   VLDL 14 06/23/2009 0221   LDLCALC 75 12/25/2014   Assessment & Plan 1. Gastroesophageal reflux disease without esophagitis Stable. Continue zantac 150mg  daily and monitor.   2. Vascular dementia without behavioral disturbance Advanced. Further declined anticipated. Continue namenda xr 28mg  daily and asa 81mg  daily. Continue assist with ADLs and monitor for change in behaviors. Continue fall risk and pressure ulcer prophylaxis.   3. Depression Mood stable. Continue zoloft 25mg  daily and monitor for change in mood.   4. Vitamin D deficiency Stable. Continue vitamin D 2000 iu daily and monitor.   5. Bilateral leg edema Chronic. Mostly likely chronic venous insufficiency. No TED hose per family request. Encourage elevating BLE while in bed. Continue to monitor  6. Protein-calorie malnutrition Weight stable over the past 30 days. Further declined anticipated with advanced dementia. Continue pureed diet, thin liquids, with med pass 60mL four times daily and magic cup three times daily for dietary supplement. Continue to monitor her status.   7. Yeast dermatitis Nystatin 100,000u/g apply topically twice daily to affected area until  resolved. Continue to monitor  Family/Staff Communication Plan of care discuss with nursing staff. Nursing staff verbalize understanding and agree with plan of care. No additional questions or concerns reported.    Loura BackKim Dominica Kent, MSN, AGNP-C [facility] [address] [place], [place] [address] [phone] [8am-5pm] After hours: [phone]

## 2015-04-17 ENCOUNTER — Non-Acute Institutional Stay (SKILLED_NURSING_FACILITY): Payer: Medicare Other | Admitting: Registered Nurse

## 2015-04-17 ENCOUNTER — Encounter: Payer: Self-pay | Admitting: Registered Nurse

## 2015-04-17 DIAGNOSIS — E559 Vitamin D deficiency, unspecified: Secondary | ICD-10-CM

## 2015-04-17 DIAGNOSIS — K219 Gastro-esophageal reflux disease without esophagitis: Secondary | ICD-10-CM | POA: Diagnosis not present

## 2015-04-17 DIAGNOSIS — F329 Major depressive disorder, single episode, unspecified: Secondary | ICD-10-CM | POA: Diagnosis not present

## 2015-04-17 DIAGNOSIS — I482 Chronic atrial fibrillation, unspecified: Secondary | ICD-10-CM

## 2015-04-17 DIAGNOSIS — R600 Localized edema: Secondary | ICD-10-CM | POA: Diagnosis not present

## 2015-04-17 DIAGNOSIS — E46 Unspecified protein-calorie malnutrition: Secondary | ICD-10-CM | POA: Diagnosis not present

## 2015-04-17 DIAGNOSIS — F0150 Vascular dementia without behavioral disturbance: Secondary | ICD-10-CM

## 2015-04-17 DIAGNOSIS — F0280 Dementia in other diseases classified elsewhere without behavioral disturbance: Secondary | ICD-10-CM | POA: Diagnosis not present

## 2015-04-17 DIAGNOSIS — F0393 Unspecified dementia, unspecified severity, with mood disturbance: Secondary | ICD-10-CM

## 2015-04-17 NOTE — Progress Notes (Signed)
Patient ID: Nancy [name], female   DOB: 02/13/1924, 81 y.o.   MRN: [id number]   Place of Service: [facility]  Allergies  Allergen Reactions  . Macrobid [Nitrofurantoin]     unsure    Code Status: DNR  Goals of Care: Comfort and Quality of Life/Long term care  Chief Complaint  Patient presents with  . Medical Management of Chronic Issues    dementia, ble swelling, GERD, vit d def., pcm, depression    HPI 81 y.o. female with PMH of vascular dementia, chronic afib, HTN, GERD, depression among others is being seen for a routine visit for management of her chronic issues. Weight continues to decline-2lbs weight loss over the past 30 days. No recent falls or skin concerns reported.  No change in behavior or functional status reported. No concerns from nursing staff.  Dementia is advanced. Mood stable on zoloft. No issues with GERD. BLE swelling persists. Seen in room today, unable to participate in HPI and ROS.  Review of Systems Unable to obtain due to dementia, but appear in no distress  Past Medical History  Diagnosis Date  . Depression   . Hypertension   . GERD (gastroesophageal reflux disease)   . Dysphagia   . Rhabdomyolysis   . UTI (lower urinary tract infection)   . Chronic ulcer   . Altered mental status   . Subdural hematoma   . Myocardial infarction   . Ulcer   . GI bleed   . Vascular dementia without behavioral disturbance 01/05/2014    Past Surgical History  Procedure Laterality Date  . Cholecystectomy    . Hernia repair      History   Social History  . Marital Status: Widowed    Spouse Name: N/A  . Number of Children: N/A  . Years of Education: N/A   Occupational History  . Not on file.   Social History Main Topics  . Smoking status: Never Smoker   . Smokeless tobacco: Never Used  . Alcohol Use: No  . Drug Use: No  . Sexual Activity: No   Other Topics Concern  . Not on file   Social History Narrative      Medication List       This list is accurate as of: 04/17/15 10:15 PM.  Always use your most recent med list.               acetaminophen 325 MG tablet  Commonly known as:  TYLENOL  Take 650 mg by mouth every 4 (four) hours as needed (pain).     aspirin 81 MG tablet  Take 81 mg by mouth daily.     beta carotene w/minerals tablet  Take 1 tablet by mouth daily.     cholecalciferol 1000 UNITS tablet  Commonly known as:  VITAMIN D  Take 2 tablets (2,000 Units total) by mouth daily.     NAMENDA XR 28 MG Cp24 24 hr capsule  Generic drug:  memantine  Take 28 mg by mouth daily.     ranitidine 150 MG capsule  Commonly known as:  ZANTAC  Take 150 mg by mouth daily as needed for heartburn.     sertraline 25 MG tablet  Commonly known as:  ZOLOFT  Take 37.5 mg by mouth daily.        Physical Exam  BP 101/65 mmHg  Pulse 83  Temp(Src) 96.5 F (35.8 C)  Resp 16  Ht 5' (1.524 m)  Wt 95 lb 12.8  oz (43.455 kg)  BMI 18.71 kg/m2  SpO2 94%   Constitutional: frail elderly female in no acute distress. Pleasantly confused. Able to follow simple commands.  HEENT: Normocephalic and atraumatic. PERRL. Oral mucosa moist.   Neck: Supple and nontender. No lymphadenopathy, masses, or thyromegaly. No JVD or carotid bruits. Cardiac: Normal S1, S2. Irregularly irregluar without appreciable murmurs, rubs, or gallops. Distal pulses intact. 2+ pitting dependent edema Lungs: No respiratory distress. Breath sounds clear bilaterally without rales, rhonchi, or wheezes. Abdomen: Audible bowel sounds in all quadrants. Soft, nontender, nondistended.   Musculoskeletal: Able to move all extremities.  Skin: Warm and dry. BLE erythematous, cool to touch.  Neurological: Alert.  Psychiatric: flat affect  Labs Reviewed CBC Latest Ref Rng 11/26/2014 05/29/2014 11/30/2009  WBC - 5.4 5.9 5.2  Hemoglobin 12.0 - 16.0 g/dL 16.1 11.5(A) 10.9 DELTA CHECK NOTED(L)  Hematocrit 36 - 46 % 42 37 32.5(L)  Platelets 150 - 399 K/L 241 218 287     CMP Latest Ref Rng 12/25/2014 03/07/2014 11/30/2009  Glucose 70 - 99 mg/dL - - 77  BUN 4 - 21 mg/dL 09(U) 04(V) 6  Creatinine 0.5 - 1.1 mg/dL 0.8 0.6 4.09  Sodium 811 - 147 mmol/L 139 140 138 DELTA CHECK NOTED  Potassium 3.4 - 5.3 mmol/L 4.3 4.4 3.6  Chloride 96 - 112 mEq/L - - 111  CO2 19 - 32 mEq/L - - 24  Calcium 8.4 - 10.5 mg/dL - - 7.7(L)  Total Protein 6.0 - 8.3 g/dL - - -  Total Bilirubin 0.3 - 1.2 mg/dL - - -  Alkaline Phos 39 - 117 U/L - - -  AST 0 - 37 U/L - - -  ALT 0 - 35 U/L - - -   Lipid Panel     Component Value Date/Time   CHOL 145 12/25/2014   TRIG 96 12/25/2014   HDL 51 12/25/2014   CHOLHDL 2.1 06/23/2009 0221   VLDL 14 06/23/2009 0221   LDLCALC 75 12/25/2014   Assessment & Plan 1. Gastroesophageal reflux disease without esophagitis No issues. Change zantac to  daily as needed. Monitor for symptoms  2. Vascular dementia without behavioral disturbance Advanced. Further declined anticipated. Continue namenda xr  daily and asa  daily. Continue assist with ADLs. Continue fall risk and pressure ulcer prophylaxis. Monitor for change in behaviors  3. Depression Mood stable. Continue zoloft 37.5mg  daily and monitor for change in mood.   4. Vitamin D deficiency Continue vitamin D 2000 iu daily and monitor.   5. Bilateral leg edema/venous insufficiency  Chronic. No TED hose per family request. Encourage elevating BLE while in bed.   6. Protein-calorie malnutrition Gradual decline in weight-2lbs over the past 30 days. Further declined anticipated with advanced dementia. Continue pureed diet, thin liquids, with med pass 60mL four times daily and magic cup three times daily for dietary supplement. Continue to monitor weight and nutritional status. Cbc, bmp next lab draw  7. Chronic afib Stable. Rate controlled. Not on anticoagulation r/t to advanced age, GI bleed, and high fall risk. Continue aspirin  daily  Labs ordered: cbc, bmp next lab  draw  Family/Staff Communication Plan of care discuss with nursing staff. Nursing staff verbalize understanding and agree with plan of care. No additional questions or concerns reported.    Loura Back, MSN, AGNP-C [facility] [address] [place], [place] [address] [phone] [8am-5pm] After hours: [phone]

## 2015-07-18 ENCOUNTER — Non-Acute Institutional Stay (SKILLED_NURSING_FACILITY): Payer: Medicare Other | Admitting: Internal Medicine

## 2015-07-18 ENCOUNTER — Other Ambulatory Visit: Payer: Self-pay | Admitting: *Deleted

## 2015-07-18 DIAGNOSIS — K219 Gastro-esophageal reflux disease without esophagitis: Secondary | ICD-10-CM | POA: Diagnosis not present

## 2015-07-18 DIAGNOSIS — S91114A Laceration without foreign body of right lesser toe(s) without damage to nail, initial encounter: Secondary | ICD-10-CM | POA: Insufficient documentation

## 2015-07-18 DIAGNOSIS — F03C0 Unspecified dementia, severe, without behavioral disturbance, psychotic disturbance, mood disturbance, and anxiety: Secondary | ICD-10-CM | POA: Insufficient documentation

## 2015-07-18 DIAGNOSIS — I739 Peripheral vascular disease, unspecified: Secondary | ICD-10-CM | POA: Diagnosis not present

## 2015-07-18 DIAGNOSIS — F0390 Unspecified dementia without behavioral disturbance: Secondary | ICD-10-CM

## 2015-07-18 DIAGNOSIS — H04123 Dry eye syndrome of bilateral lacrimal glands: Secondary | ICD-10-CM | POA: Diagnosis not present

## 2015-07-18 DIAGNOSIS — S91114S Laceration without foreign body of right lesser toe(s) without damage to nail, sequela: Secondary | ICD-10-CM

## 2015-07-18 MED ORDER — LORAZEPAM 0.5 MG PO TABS: ORAL_TABLET | ORAL | Status: AC

## 2015-08-16 NOTE — Progress Notes (Signed)
Patient ID: Nancy [name], female   DOB: 07/24/1924, 81 y.o.   MRN: [id number]       Facility: [facility]   Chief Complaint  Patient presents with  . Medical Management of Chronic Issues   Allergies  Allergen Reactions  . Macrobid [Nitrofurantoin]     unsure   HPI 81 y/o patient seen today for routine visit. Last night she was found to be unresponsive for few seconds with her extremities turning blue. Her son was present at bedside and did not want her sent to the hospital for evaluation. She has had frequent episodes of agitation this am. No other concerns from staff. Pt seen in her room with supervisor at bedside. She has PMH of vascular dementia, HTN, GERD, depression, PVD among others. She needs assistance with feeding. She has a laceration to right 2nd toe and is followed by wound care.  ROS Unable to obtain   Past Medical History  Diagnosis Date  . Depression   . Hypertension   . GERD (gastroesophageal reflux disease)   . Dysphagia   . Rhabdomyolysis   . UTI (lower urinary tract infection)   . Chronic ulcer   . Altered mental status   . Subdural hematoma   . Myocardial infarction   . Ulcer   . GI bleed   . Vascular dementia without behavioral disturbance 01/05/2014     Medication List       This list is accurate as of: 08/03/2015  2:08 PM.  Always use your most recent med list.               acetaminophen 325 MG tablet  Commonly known as:  TYLENOL  Take 650 mg by mouth every 4 (four) hours as needed (pain).     aspirin 81 MG tablet  Take 81 mg by mouth daily.     beta carotene w/minerals tablet  Take 1 tablet by mouth daily.     cholecalciferol 1000 UNITS tablet  Commonly known as:  VITAMIN D  Take 2 tablets (2,000 Units total) by mouth daily.     NAMENDA XR 28 MG Cp24 24 hr capsule  Generic drug:  memantine  Take 28 mg by mouth daily.     ranitidine 150 MG capsule  Commonly known as:  ZANTAC  Take 150 mg by mouth daily as  needed for heartburn.     sertraline 25 MG tablet  Commonly known as:  ZOLOFT  Take 37.5 mg by mouth daily.       Physical exam Pulse 52  Temp(Src) 97 F (36.1 C)  Resp 18  SpO2 95%  General- elderly frail, thin built female in no acute distress Head- atraumatic, normocephalic Eyes- PERRLA, EOMI, no pallor, no icterus, no discharge Neck- no lymphadenopathy Mouth- normal mucus membrane Cardiovascular- normal s1,s2, no murmurs, poor leg circulation, palpable radial pulses but unable to record BP reading Respiratory- bilateral clear to auscultation, no wheeze, no rhonchi, no crackles Abdomen- bowel sounds present, soft, non tender Musculoskeletal- has dependent leg edema with chronic venous stasis changes Neurological- responds to name call by opening her eyes Psychiatry- anxious  Assessment/plan  Vascular dementia Advanced. Spoke with her son Mr Simmonds. Goal of care is comfort care and son agrees to hospice care. Discussed about discontinuing some of her medication. D/c namenda xr and vitamin d. Wean sertraline to 25 mg daily for a week and stop. Complete assistance and pt to be under total care. Hospice consult placed. Start  ativan 0.25 mg q4h prn anxiety/ agitation for now  PVD With compromised lower extremity circulation and BP unrecordable. Continue baby aspirin.   Right 2nd toe laceration Continue skinc are with silver alleginate dressing  Dry eyes Continue her ocuvite as tolerated  gerd Continue zantac 150 mg daily for now  Oneal Grout, MD  [facility] [phone] (Monday-Friday 8 am - 5 pm) [phone] (afterhours)

## 2015-08-16 DEATH — deceased
